# Patient Record
Sex: Male | Born: 1990 | Race: White | Hispanic: No | Marital: Married | Smoking: Never smoker
Health system: Southern US, Community
[De-identification: ages and names within clinical notes are randomized; demographics above are authoritative.]

## PROBLEM LIST (undated history)

## (undated) DIAGNOSIS — F3181 Bipolar II disorder: Secondary | ICD-10-CM

## (undated) DIAGNOSIS — I10 Essential (primary) hypertension: Secondary | ICD-10-CM

## (undated) DIAGNOSIS — F419 Anxiety disorder, unspecified: Secondary | ICD-10-CM

## (undated) DIAGNOSIS — R569 Unspecified convulsions: Secondary | ICD-10-CM

## (undated) DIAGNOSIS — F32A Depression, unspecified: Secondary | ICD-10-CM

## (undated) HISTORY — DX: Unspecified convulsions: R56.9

## (undated) HISTORY — DX: Bipolar II disorder: F31.81

---

## 2018-03-07 DIAGNOSIS — J0140 Acute pansinusitis, unspecified: Secondary | ICD-10-CM | POA: Diagnosis not present

## 2018-04-05 DIAGNOSIS — J40 Bronchitis, not specified as acute or chronic: Secondary | ICD-10-CM | POA: Diagnosis not present

## 2018-04-05 DIAGNOSIS — J0140 Acute pansinusitis, unspecified: Secondary | ICD-10-CM | POA: Diagnosis not present

## 2018-07-04 DIAGNOSIS — K219 Gastro-esophageal reflux disease without esophagitis: Secondary | ICD-10-CM | POA: Diagnosis not present

## 2018-07-04 DIAGNOSIS — J0101 Acute recurrent maxillary sinusitis: Secondary | ICD-10-CM | POA: Diagnosis not present

## 2021-02-15 ENCOUNTER — Emergency Department (HOSPITAL_COMMUNITY): Payer: Managed Care, Other (non HMO)

## 2021-02-15 ENCOUNTER — Encounter (HOSPITAL_COMMUNITY): Payer: Self-pay

## 2021-02-15 ENCOUNTER — Observation Stay (HOSPITAL_COMMUNITY)
Admission: EM | Admit: 2021-02-15 | Discharge: 2021-02-16 | Disposition: A | Discharge status: Home / Self Care | Payer: Managed Care, Other (non HMO) | Attending: Internal Medicine | Admitting: Internal Medicine

## 2021-02-15 ENCOUNTER — Observation Stay (HOSPITAL_COMMUNITY): Payer: Managed Care, Other (non HMO)

## 2021-02-15 DIAGNOSIS — R569 Unspecified convulsions: Secondary | ICD-10-CM

## 2021-02-15 DIAGNOSIS — Y900 Blood alcohol level of less than 20 mg/100 ml: Secondary | ICD-10-CM | POA: Insufficient documentation

## 2021-02-15 DIAGNOSIS — Z79899 Other long term (current) drug therapy: Secondary | ICD-10-CM | POA: Diagnosis not present

## 2021-02-15 DIAGNOSIS — W01198A Fall on same level from slipping, tripping and stumbling with subsequent striking against other object, initial encounter: Secondary | ICD-10-CM | POA: Diagnosis not present

## 2021-02-15 DIAGNOSIS — I10 Essential (primary) hypertension: Secondary | ICD-10-CM | POA: Diagnosis not present

## 2021-02-15 DIAGNOSIS — Z20822 Contact with and (suspected) exposure to covid-19: Secondary | ICD-10-CM | POA: Diagnosis not present

## 2021-02-15 DIAGNOSIS — S0182XA Laceration with foreign body of other part of head, initial encounter: Secondary | ICD-10-CM | POA: Diagnosis present

## 2021-02-15 HISTORY — DX: Depression, unspecified: F32.A

## 2021-02-15 HISTORY — DX: Essential (primary) hypertension: I10

## 2021-02-15 HISTORY — DX: Anxiety disorder, unspecified: F41.9

## 2021-02-15 LAB — URINALYSIS, ROUTINE W REFLEX MICROSCOPIC
Bacteria, UA: NONE SEEN
Bilirubin Urine: NEGATIVE
Glucose, UA: NEGATIVE mg/dL
Ketones, ur: 5 mg/dL — AB
Leukocytes,Ua: NEGATIVE
Nitrite: NEGATIVE
Protein, ur: NEGATIVE mg/dL
Specific Gravity, Urine: 1.021 (ref 1.005–1.030)
pH: 5 (ref 5.0–8.0)

## 2021-02-15 LAB — CBC
HCT: 44.9 % (ref 39.0–52.0)
HCT: 44 % (ref 39.0–52.0)
Hemoglobin: 15.6 g/dL (ref 13.0–17.0)
Hemoglobin: 15.3 g/dL (ref 13.0–17.0)
MCH: 31.1 pg (ref 26.0–34.0)
MCH: 31.4 pg (ref 26.0–34.0)
MCHC: 34.7 g/dL (ref 30.0–36.0)
MCHC: 34.8 g/dL (ref 30.0–36.0)
MCV: 89.6 fL (ref 80.0–100.0)
MCV: 90.2 fL (ref 80.0–100.0)
Platelets: 270 10*3/uL (ref 150–400)
Platelets: 243 10*3/uL (ref 150–400)
RBC: 5.01 MIL/uL (ref 4.22–5.81)
RBC: 4.88 MIL/uL (ref 4.22–5.81)
RDW: 12.8 % (ref 11.5–15.5)
RDW: 12.9 % (ref 11.5–15.5)
WBC: 12.5 10*3/uL — ABNORMAL HIGH (ref 4.0–10.5)
WBC: 10.2 10*3/uL (ref 4.0–10.5)
nRBC: 0 % (ref 0.0–0.2)
nRBC: 0 % (ref 0.0–0.2)

## 2021-02-15 LAB — BASIC METABOLIC PANEL
Anion gap: 7 (ref 5–15)
BUN: 15 mg/dL (ref 6–20)
CO2: 25 mmol/L (ref 22–32)
Calcium: 8.6 mg/dL — ABNORMAL LOW (ref 8.9–10.3)
Chloride: 105 mmol/L (ref 98–111)
Creatinine, Ser: 1.27 mg/dL — ABNORMAL HIGH (ref 0.61–1.24)
GFR, Estimated: >60 mL/min (ref >60)
Glucose, Bld: 104 mg/dL — ABNORMAL HIGH (ref 70–99)
Potassium: 4.1 mmol/L (ref 3.5–5.1)
Sodium: 137 mmol/L (ref 135–145)

## 2021-02-15 LAB — CBG MONITORING, ED
Glucose-Capillary: 87 mg/dL (ref 70–99)
Glucose-Capillary: 87 mg/dL (ref 70–99)

## 2021-02-15 LAB — RAPID URINE DRUG SCREEN, HOSP PERFORMED
Amphetamines: NOT DETECTED
Barbiturates: NOT DETECTED
Benzodiazepines: NOT DETECTED
Cocaine: NOT DETECTED
Opiates: NOT DETECTED
Tetrahydrocannabinol: NOT DETECTED

## 2021-02-15 LAB — CREATININE, SERUM
Creatinine, Ser: 0.98 mg/dL (ref 0.61–1.24)
GFR, Estimated: >60 mL/min (ref >60)

## 2021-02-15 LAB — RESP PANEL BY RT-PCR (FLU A&B, COVID) ARPGX2
Influenza A by PCR: NEGATIVE
Influenza B by PCR: NEGATIVE
SARS Coronavirus 2 by RT PCR: NEGATIVE

## 2021-02-15 LAB — ETHANOL: Alcohol, Ethyl (B): <10 mg/dL (ref <10)

## 2021-02-15 LAB — MAGNESIUM: Magnesium: 2 mg/dL (ref 1.7–2.4)

## 2021-02-15 MED ORDER — ENOXAPARIN SODIUM 40 MG/0.4ML IJ SOSY
40.0000 mg | PREFILLED_SYRINGE | INTRAMUSCULAR | Status: DC
  Administered 2021-02-15: 40 mg via SUBCUTANEOUS
  Filled 2021-02-15: qty 0.4

## 2021-02-15 MED ORDER — BACITRACIN ZINC 500 UNIT/GM EX OINT
TOPICAL_OINTMENT | Freq: Two times a day (BID) | CUTANEOUS | Status: DC
  Administered 2021-02-15 – 2021-02-16 (×2): 1 via TOPICAL
  Filled 2021-02-15 (×2): qty 0.9

## 2021-02-15 MED ORDER — LIDOCAINE-EPINEPHRINE (PF) 2 %-1:200000 IJ SOLN
10.0000 mL | Freq: Once | INTRAMUSCULAR | Status: DC
  Filled 2021-02-15: qty 20

## 2021-02-15 MED ORDER — NADOLOL 40 MG PO TABS
40.0000 mg | ORAL_TABLET | Freq: Every day | ORAL | Status: DC
  Administered 2021-02-16: 40 mg via ORAL
  Filled 2021-02-15: qty 1

## 2021-02-15 MED ORDER — ACETAMINOPHEN 650 MG RE SUPP: 650.0000 mg | Freq: Four times a day (QID) | RECTAL | Status: DC | PRN

## 2021-02-15 MED ORDER — ACETAMINOPHEN 325 MG PO TABS
650.0000 mg | ORAL_TABLET | Freq: Once | ORAL | Status: AC
  Administered 2021-02-15: 650 mg via ORAL
  Filled 2021-02-15: qty 2

## 2021-02-15 MED ORDER — ACETAMINOPHEN 325 MG PO TABS
650.0000 mg | ORAL_TABLET | Freq: Four times a day (QID) | ORAL | Status: DC | PRN
  Administered 2021-02-16: 650 mg via ORAL
  Filled 2021-02-15: qty 2

## 2021-02-15 MED ORDER — ARIPIPRAZOLE 10 MG PO TABS
10.0000 mg | ORAL_TABLET | Freq: Every day | ORAL | Status: DC
  Administered 2021-02-15: 10 mg via ORAL
  Filled 2021-02-15: qty 1

## 2021-02-15 MED ORDER — LACTATED RINGERS IV BOLUS
1000.0000 mL | Freq: Once | INTRAVENOUS | Status: AC
  Administered 2021-02-15: 1000 mL via INTRAVENOUS

## 2021-02-15 MED ORDER — GADOBUTROL 1 MMOL/ML IV SOLN
10.0000 mL | Freq: Once | INTRAVENOUS | Status: AC | PRN
  Administered 2021-02-15: 10 mL via INTRAVENOUS

## 2021-02-15 NOTE — ED Notes (Signed)
Patient transported to MRI 

## 2021-02-15 NOTE — ED Notes (Signed)
Visitor at bedside. Lac tray at bedside.

## 2021-02-15 NOTE — ED Triage Notes (Addendum)
Pt arrived via [facility] from [facility]. Pt was sitting at the table. Pt had a syncopal episode, turned pale, lasting for about 2 minutes and hit his chin. Lac on chin suture appropriate.   C/o headache   Denies recreational drug use.   CBG-105 P-62 99% RA  Takes Abilify and recently changed the time of day he takes it.   Takes a BP meds ending in LOL (beta blocker)   18g L-AC  A/ox4

## 2021-02-15 NOTE — ED Provider Notes (Signed)
[facility]-EMERGENCY DEPT Provider Note   CSN: [id number] Arrival date & time: 02/15/21  1349     History Chief Complaint  Patient presents with   Loss of Consciousness   Laceration    William Flowers is a 30 y.o. male who presents after seizure-like episode this afternoon.  Patient was at the bookstore with his wife when he began to feel very warm, suddenly developed tunnel vision and lost consciousness.  According to his wife he fell forward flat forward onto his face on the ground and his upper extremities were rigid, his jaw was clenched, he was groaning, and his whole body was shaking.  This lasted for approximately 2 minutes before it resolved.  He took him another few minutes to become oriented again but he was able to get up off the ground with assistance and walk a few feet away to sit at table.  He hit his chin on the tile surface and splinted open with significant of bleeding.  Patient denies any history of the same.  He does state that he is on Abilify for bipolar 2; recently his medication was changed from taking in the morning to taking in the evening without change in the dose.  This occurred 3 weeks ago.  This change was made by his psychiatrist due to episodes of brain fog, and concern by psychiatry that he may be having brief intermittent focal seizures.  No outpatient work-up for seizure activity was established.  I have personally reviewed this patient's medical records. He has hx of Bipolar II, hypertension on a betablocker.   HPI     Past Medical History:  Diagnosis Date   Anxiety    Depression    Hypertension     Patient Active Problem List   Diagnosis Date Noted   Seizure (HCC) 02/15/2021   Seizures (HCC) 02/15/2021     History reviewed. No pertinent surgical history.     Family History  Problem Relation Age of Onset   Seizures Neg Hx     Social History   Tobacco Use   Smoking status: Never   Smokeless tobacco: Never   Vaping Use   Vaping Use: Former  Substance Use Topics   Alcohol use: Yes    Comment: occasionally   Drug use: Never    Home Medications Prior to Admission medications   Medication Sig Start Date End Date Taking? Authorizing Provider  ARIPiprazole (ABILIFY) 10 MG tablet Take 10 mg by mouth daily. 11/28/20  Yes [provider]  levocetirizine (XYZAL) 5 MG tablet Take 5 mg by mouth daily as needed for allergies.   Yes [provider]  nadolol (CORGARD) 40 MG tablet Take 40 mg by mouth daily. 12/30/20  Yes [provider]    Allergies    Patient has no known allergies.  Review of Systems   Review of Systems  Constitutional: Negative.   HENT: Negative.    Eyes: Negative.   Respiratory: Negative.    Cardiovascular: Negative.   Gastrointestinal: Negative.   Genitourinary: Negative.   Musculoskeletal: Negative.   Skin:  Positive for wound. Negative for color change and pallor.  Neurological:  Positive for seizures, syncope and light-headedness.  Psychiatric/Behavioral: Negative.     Physical Exam Updated Vital Signs BP 121/88   Pulse 78   Temp 98.5 F (36.9 C) (Oral)   Resp (!) 25   Ht 5\' 11"  (1.803 m)   Wt 113.4 kg   SpO2 97%   BMI 34.87 kg/m  Physical Exam Vitals and nursing note reviewed.  Constitutional:      Appearance: He is obese. He is not ill-appearing or toxic-appearing.  HENT:     Head: Normocephalic. No raccoon eyes or Battle's sign.      Nose: Congestion present.     Right Nostril: No epistaxis or septal hematoma.     Left Nostril: No epistaxis or septal hematoma.     Mouth/Throat:     Mouth: Mucous membranes are moist.     Pharynx: Oropharynx is clear. Uvula midline. No oropharyngeal exudate or posterior oropharyngeal erythema.     Tonsils: No tonsillar exudate.  Eyes:     General: Lids are normal. Vision grossly intact.        Right eye: No discharge.        Left eye: No discharge.     Extraocular Movements: Extraocular  movements intact.     Conjunctiva/sclera: Conjunctivae normal.     Pupils: Pupils are equal, round, and reactive to light.  Neck:     Trachea: Trachea and phonation normal.     Meningeal: Brudzinski's sign and Kernig's sign absent.  Cardiovascular:     Rate and Rhythm: Normal rate and regular rhythm.     Pulses: Normal pulses.     Heart sounds: Normal heart sounds. No murmur heard. Pulmonary:     Effort: Pulmonary effort is normal. No tachypnea, bradypnea, accessory muscle usage, prolonged expiration or respiratory distress.     Breath sounds: Normal breath sounds. No wheezing or rales.  Chest:     Chest wall: No mass, lacerations, deformity, swelling, tenderness, crepitus or edema.  Abdominal:     General: Bowel sounds are normal. There is no distension.     Palpations: Abdomen is soft.     Tenderness: There is no abdominal tenderness. There is no right CVA tenderness, left CVA tenderness, guarding or rebound.  Musculoskeletal:        General: No deformity.     Cervical back: Normal range of motion and neck supple. No edema, rigidity or crepitus. No pain with movement, spinous process tenderness or muscular tenderness.     Right lower leg: No edema.     Left lower leg: No edema.  Lymphadenopathy:     Cervical: No cervical adenopathy.  Skin:    General: Skin is warm and dry.     Capillary Refill: Capillary refill takes less than 2 seconds.     Findings: Laceration present.  Neurological:     General: No focal deficit present.     Mental Status: He is alert and oriented to person, place, and time. Mental status is at baseline.     GCS: GCS eye subscore is 4. GCS verbal subscore is 5. GCS motor subscore is 6.     Cranial Nerves: Cranial nerves are intact.     Sensory: Sensation is intact.     Motor: Motor function is intact.     Coordination: Coordination is intact.     Gait: Gait is intact. Gait normal.  Psychiatric:        Mood and Affect: Mood normal.    ED Results /  Procedures / Treatments   Labs (all labs ordered are listed, but only abnormal results are displayed) Labs Reviewed  BASIC METABOLIC PANEL - Abnormal; Notable for the following components:      Result Value   Glucose, Bld 104 (*)    Creatinine, Ser 1.27 (*)    Calcium 8.6 (*)    All  other components within normal limits  CBC - Abnormal; Notable for the following components:   WBC 12.5 (*)    All other components within normal limits  URINALYSIS, ROUTINE W REFLEX MICROSCOPIC - Abnormal; Notable for the following components:   Hgb urine dipstick MODERATE (*)    Ketones, ur 5 (*)    All other components within normal limits  RESP PANEL BY RT-PCR (FLU A&B, COVID) ARPGX2  MAGNESIUM  RAPID URINE DRUG SCREEN, HOSP PERFORMED  ETHANOL  CBG MONITORING, ED  CBG MONITORING, ED    EKG EKG Interpretation  Date/Time:  Tuesday February 15 2021 14:23:04 EDT Ventricular Rate:  79 PR Interval:  170 QRS Duration: 89 QT Interval:  395 QTC Calculation: 453 R Axis:   17 Text Interpretation: Sinus rhythm Consider inferior infarct No acute changes No old tracing to compare Confirmed by Derwood Kaplan [phone]) on 02/15/2021 7:12:50 PM  Radiology CT HEAD WO CONTRAST ( )  Result Date: 02/15/2021 CLINICAL DATA:  Seizure, nontraumatic.  Syncopal episode. EXAM: CT HEAD WITHOUT CONTRAST TECHNIQUE: Contiguous axial images were obtained from the base of the skull through the vertex without intravenous contrast. COMPARISON:  None. FINDINGS: Brain: The brain shows a normal appearance without evidence of malformation, atrophy, old or acute small or large vessel infarction, mass lesion, hemorrhage, hydrocephalus or extra-axial collection. Vascular: No hyperdense vessel. No evidence of atherosclerotic calcification. Skull: Normal.  No traumatic finding.  No focal bone lesion. Sinuses/Orbits: Sinuses are clear. Orbits appear normal. Mastoids are clear. Other: None significant IMPRESSION: Normal head CT.  Electronically Signed   By: Paulina Fusi M.D.   On: 02/15/2021 16:38   MR BRAIN W WO CONTRAST  Result Date: 02/15/2021 CLINICAL DATA:  Seizure. EXAM: MRI HEAD WITHOUT AND WITH CONTRAST TECHNIQUE: Multiplanar, multiecho pulse sequences of the brain and surrounding structures were obtained without and with intravenous contrast. CONTRAST:  48mL GADAVIST GADOBUTROL 1 MMOL/ML IV SOLN COMPARISON:  Head CT 02/15/2021 FINDINGS: Brain: Dedicated thin section imaging through the temporal lobes demonstrates normal volume and signal of the hippocampi. There is no evidence of heterotopia or cortical dysplasia. There is no evidence of an acute infarct, intracranial hemorrhage, mass, midline shift, or extra-axial fluid collection. The ventricles and sulci are normal. The brain is normal in signal. No abnormal enhancement is identified. Vascular: Major intracranial vascular flow voids are preserved. Skull and upper cervical spine: Unremarkable bone marrow signal. Sinuses/Orbits: Unremarkable orbits. Mild mucosal thickening in the paranasal sinuses. Clear mastoid air cells. Other: None. IMPRESSION: Unremarkable appearance of the brain. Electronically Signed   By: Sebastian Ache M.D.   On: 02/15/2021 21:02    Procedures Procedures   Medications Ordered in ED Medications  lactated ringers bolus 1,000 mL (0 mLs Intravenous Stopped 02/15/21 1748)  acetaminophen (TYLENOL) tablet 650 mg (650 mg Oral Given 02/15/21 1810)  gadobutrol (GADAVIST) 1 MMOL/ML injection 10 mL (10 mLs Intravenous Contrast Given 02/15/21 2019)    ED Course  I have reviewed the triage vital signs and the nursing notes.  Pertinent labs & imaging results that were available during my care of the patient were reviewed by me and considered in my medical decision making (see chart for details).  Clinical Course as of 02/15/21 2109  Tue Feb 15, 2021  1823 Consult to neurologist Dr. Otelia Limes, who recommends admission to [facility] medicine service. Recommends  holding abilify at this time, will defer anticonvulsants until evaluation off of abilify.  Will need EEG and MRI with and without contrast of the brain on  admission.  I appreciate his collaboration in the care of this patient.  [RS]    Clinical Course User Index [RS] Stormie Ventola, Eugene Gavia, PA-C   MDM Rules/Calculators/A&P                         30 year old male presents with concern for seizure-like activity this afternoon.  No history of the same.  Differential diagnosis includes but is not limited to first-time seizure, allergic seizures, metabolic derangement, drug effect, encephalitis or meningitis, syncope, intracranial mass.  Vital signs are normal on intake.  Cardiopulmonary exam is normal, abdominal exam is benign.  Patient with normal neurologic exam without focal deficit.  Does have a 2 cm laceration underneath the chin which is oozing small amount of blood.  CBC with mild leukocytosis of 12.5, BMP with creatinine of 1.27, GFR greater than 60 and normal BUN.  CBG 87 1 hour ago.  EKG is reassuring with sinus rhythm without STEMI.  Magnesium and ethanol levels pending, UDS and UA pending.  We will proceed with CT of the head given first-time seizure.  CT head negative for acute intracranial abnormality.  UDS negative, ethanol negative.  Magnesium is also normal as well as potassium.  UA without sign of infection.  Consult to neurology as above recommends admission to the medicine service at [facility] for MRI, EEG and further neuro eval.  Will defer anticonvulsant at this time further recommendation.  We will hold Abilify as well.  Consult to hospitalist as above, patient to be admitted to [facility].  Jayvyn voiced understanding of his medical evaluation and treatment plan.  Each of his questions was answered to his expressed satisfaction.  He is amenable to plan for admission at this time.  This chart was dictated using voice recognition software, Dragon. Despite the best efforts  of this provider to proofread and correct errors, errors may still occur which can change documentation meaning.   Final Clinical Impression(s) / ED Diagnoses Final diagnoses:  None    Rx / DC Orders ED Discharge Orders     None        Sherrilee Gilles 02/15/21 2110    Derwood Kaplan, MD 02/22/21 1047

## 2021-02-15 NOTE — H&P (Signed)
History and Physical    William Flowers VQQ:[id number] DOB: Aug 05, 1990 DOA: 02/15/2021  PCP: [facility]  Patient coming from: Home.  Chief Complaint: Seizure-like activity.  HPI: William Flowers is a 30 y.o. male with history of bipolar disorder and hypertension was brought to the ER after patient had a seizure-like activity as witnessed by patient's wife today.  Patient has been having spells of tunnel vision over the last 2 months which last for about 1 or 2 minutes.  Patient's psychiatrist changed patient's Abilify from morning to evening following which patient did not have the start of admission for almost a month now.  Prior to which patient was getting frequent attacks.  Today while patient was in the cafeteria with his wife patient started feeling things were working getting warm and his eyes rolled up and fell onto the floor and had a tonic-clonic seizure-like activity which lasted for around 2 to 3 minutes during which patient was not responding to any commands and the activity stopped.  Did not have any incontinence of urine or bowel or any tongue bite.  Patient does not recall the incident.  Patient appeared mildly confused after the incident.  Did not lose consciousness.  Patient was brought to the ER.  ED Course: In the ER patient appears nonfocal.  Denies any headache.  CT head and MRI brain did not show anything acute.  On-call neurologist Dr. Caryl Pina was consulted and requested admission.  At this time did not start any antiepileptic medications.  Labs show leukocytosis of 12.5.  Patient is afebrile creatinine 1.2.  COVID test was negative.  Urine shows mild ketones.  Urine drug screen is negative.  EKG shows normal sinus rhythm.  Review of Systems: As per HPI, rest all negative.   Past Medical History:  Diagnosis Date   Anxiety    Depression    Hypertension     History reviewed. No pertinent surgical history.   reports that he has never  smoked. He has never used smokeless tobacco. He reports current alcohol use. He reports that he does not use drugs.  No Known Allergies  Family History  Problem Relation Age of Onset   Seizures Neg Hx     Prior to Admission medications   Medication Sig Start Date End Date Taking? Authorizing Provider  ARIPiprazole (ABILIFY) 10 MG tablet Take 10 mg by mouth daily. 11/28/20  Yes [provider]  levocetirizine (XYZAL) 5 MG tablet Take 5 mg by mouth daily as needed for allergies.   Yes [provider]  nadolol (CORGARD) 40 MG tablet Take 40 mg by mouth daily. 12/30/20  Yes [provider]    Physical Exam: Constitutional: Moderately built and nourished. Vitals:   02/15/21 1700 02/15/21 1800 02/15/21 1900 02/15/21 1930  BP: (!) 142/88 138/85 (!) 136/95 121/88  Pulse: 85 79 89 78  Resp: 20 (!) 22 20 (!) 25  Temp:      TempSrc:      SpO2: 99% 98% 98% 97%  Weight: 113.4 kg     Height: 5\' 11"  (1.803 m)      Eyes: Anicteric no pallor. ENMT: No discharge from the ears eyes nose and mouth. Neck: No mass felt.  No neck rigidity. Respiratory: No rhonchi or crepitations. Cardiovascular: S1-S2 heard. Abdomen: Soft nontender bowel sound present. Musculoskeletal: No edema. Skin: No rash. Neurologic: Alert awake oriented to time place and person.  Moves all extremities. Psychiatric: Appears normal.  Normal affect.  Labs on Admission: I have personally reviewed following labs and imaging studies  CBC: Recent Labs  Lab 02/15/21 1423  WBC 12.5*  HGB 15.6  HCT 44.9  MCV 89.6  PLT 270   Basic Metabolic Panel: Recent Labs  Lab 02/15/21 1423 02/15/21 1607  NA 137  --   K 4.1  --   CL 105  --   CO2 25  --   GLUCOSE 104*  --   BUN 15  --   CREATININE 1.27*  --   CALCIUM 8.6*  --   MG  --  2.0   GFR: Estimated Creatinine Clearance: 110.8 mL/min (A) (by C-G formula based on SCr of 1.27 mg/dL (H)). Liver Function Tests: No results for input(s): AST,  ALT, ALKPHOS, BILITOT, PROT, ALBUMIN in the last 168 hours. No results for input(s): LIPASE, AMYLASE in the last 168 hours. No results for input(s): AMMONIA in the last 168 hours. Coagulation Profile: No results for input(s): INR, PROTIME in the last 168 hours. Cardiac Enzymes: No results for input(s): CKTOTAL, CKMB, CKMBINDEX, TROPONINI in the last 168 hours. BNP (last 3 results) No results for input(s): PROBNP in the last 8760 hours. HbA1C: No results for input(s): HGBA1C in the last 72 hours. CBG: Recent Labs  Lab 02/15/21 1415 02/15/21 1603  GLUCAP 87 87   Lipid Profile: No results for input(s): CHOL, HDL, LDLCALC, TRIG, CHOLHDL, LDLDIRECT in the last 72 hours. Thyroid Function Tests: No results for input(s): TSH, T4TOTAL, FREET4, T3FREE, THYROIDAB in the last 72 hours. Anemia Panel: No results for input(s): VITAMINB12, FOLATE, FERRITIN, TIBC, IRON, RETICCTPCT in the last 72 hours. Urine analysis:    Component Value Date/Time   COLORURINE YELLOW 02/15/2021 1607   APPEARANCEUR CLEAR 02/15/2021 1607   LABSPEC 1.021 02/15/2021 1607   PHURINE 5.0 02/15/2021 1607   GLUCOSEU NEGATIVE 02/15/2021 1607   HGBUR MODERATE (A) 02/15/2021 1607   BILIRUBINUR NEGATIVE 02/15/2021 1607   KETONESUR 5 (A) 02/15/2021 1607   PROTEINUR NEGATIVE 02/15/2021 1607   NITRITE NEGATIVE 02/15/2021 1607   LEUKOCYTESUR NEGATIVE 02/15/2021 1607   Sepsis Labs: @LABRCNTIP (procalcitonin:4,lacticidven:4) ) Recent Results (from the past 240 hour(s))  Resp Panel by RT-PCR (Flu A&B, Covid) Nasopharyngeal Swab     Status: None   Collection Time: 02/15/21  7:01 PM   Specimen: Nasopharyngeal Swab; Nasopharyngeal(NP) swabs in vial transport medium  Result Value Ref Range Status   SARS Coronavirus 2 by RT PCR NEGATIVE NEGATIVE Final    Comment: (NOTE) SARS-CoV-2 target nucleic acids are NOT DETECTED.  The SARS-CoV-2 RNA is generally detectable in upper respiratory specimens during the acute phase of  infection. The lowest concentration of SARS-CoV-2 viral copies this assay can detect is 138 copies/mL. A negative result does not preclude SARS-Cov-2 infection and should not be used as the sole basis for treatment or other patient management decisions. A negative result may occur with  improper specimen collection/handling, submission of specimen other than nasopharyngeal swab, presence of viral mutation(s) within the areas targeted by this assay, and inadequate number of viral copies(<138 copies/mL). A negative result must be combined with clinical observations, patient history, and epidemiological information. The expected result is Negative.  Fact Sheet for Patients:  02/17/21  Fact Sheet for Healthcare Providers:  BloggerCourse.com  This test is no t yet approved or cleared by the [place] FDA and  has been authorized for detection and/or diagnosis of SARS-CoV-2 by FDA under an Emergency Use Authorization (EUA). This EUA will remain  in effect (meaning  this test can be used) for the duration of the COVID-19 declaration under Section 564(b)(1) of the Act, 21 U.S.C.section 360bbb-3(b)(1), unless the authorization is terminated  or revoked sooner.       Influenza A by PCR NEGATIVE NEGATIVE Final   Influenza B by PCR NEGATIVE NEGATIVE Final    Comment: (NOTE) The Xpert Xpress SARS-CoV-2/FLU/RSV plus assay is intended as an aid in the diagnosis of influenza from Nasopharyngeal swab specimens and should not be used as a sole basis for treatment. Nasal washings and aspirates are unacceptable for Xpert Xpress SARS-CoV-2/FLU/RSV testing.  Fact Sheet for Patients: BloggerCourse.com  Fact Sheet for Healthcare Providers: SeriousBroker.it  This test is not yet approved or cleared by the [place] FDA and has been authorized for detection and/or diagnosis of SARS-CoV-2  by FDA under an Emergency Use Authorization (EUA). This EUA will remain in effect (meaning this test can be used) for the duration of the COVID-19 declaration under Section 564(b)(1) of the Act, 21 U.S.C. section 360bbb-3(b)(1), unless the authorization is terminated or revoked.  Performed at [facility], [address] [place], [place] [address]      Radiological Exams on Admission: CT HEAD WO CONTRAST ( )  Result Date: 02/15/2021 CLINICAL DATA:  Seizure, nontraumatic.  Syncopal episode. EXAM: CT HEAD WITHOUT CONTRAST TECHNIQUE: Contiguous axial images were obtained from the base of the skull through the vertex without intravenous contrast. COMPARISON:  None. FINDINGS: Brain: The brain shows a normal appearance without evidence of malformation, atrophy, old or acute small or large vessel infarction, mass lesion, hemorrhage, hydrocephalus or extra-axial collection. Vascular: No hyperdense vessel. No evidence of atherosclerotic calcification. Skull: Normal.  No traumatic finding.  No focal bone lesion. Sinuses/Orbits: Sinuses are clear. Orbits appear normal. Mastoids are clear. Other: None significant IMPRESSION: Normal head CT. Electronically Signed   By: Paulina Fusi M.D.   On: 02/15/2021 16:38   MR BRAIN W WO CONTRAST  Result Date: 02/15/2021 CLINICAL DATA:  Seizure. EXAM: MRI HEAD WITHOUT AND WITH CONTRAST TECHNIQUE: Multiplanar, multiecho pulse sequences of the brain and surrounding structures were obtained without and with intravenous contrast. CONTRAST:  71mL GADAVIST GADOBUTROL 1 MMOL/ML IV SOLN COMPARISON:  Head CT 02/15/2021 FINDINGS: Brain: Dedicated thin section imaging through the temporal lobes demonstrates normal volume and signal of the hippocampi. There is no evidence of heterotopia or cortical dysplasia. There is no evidence of an acute infarct, intracranial hemorrhage, mass, midline shift, or extra-axial fluid collection. The ventricles and sulci are  normal. The brain is normal in signal. No abnormal enhancement is identified. Vascular: Major intracranial vascular flow voids are preserved. Skull and upper cervical spine: Unremarkable bone marrow signal. Sinuses/Orbits: Unremarkable orbits. Mild mucosal thickening in the paranasal sinuses. Clear mastoid air cells. Other: None. IMPRESSION: Unremarkable appearance of the brain. Electronically Signed   By: Sebastian Ache M.D.   On: 02/15/2021 21:02    EKG: Independently reviewed.  Normal sinus rhythm.  Assessment/Plan Principal Problem:   Seizure (HCC) Active Problems:   Seizures (HCC)   Essential hypertension   Seizure-like activity (HCC)    Seizure-like activities for which I have discussed with on-call neurologist Dr. Caryl Pina who advised patient to be transferred to [facility].  At this time no antiepileptic medication has been started.  We will be closely monitoring and will follow further recommendations per neurology. History of bipolar disorder on Abilify.   History of hypertension on beta-blockers.   DVT prophylaxis: Lovenox. Code Status: Full code. Family Communication:  Patient's wife at the bedside. Disposition Plan: Home. Consults called: Neurology. Admission status: Observation.   Eduard Clos MD [facility] Pager [phone].  If 7PM-7AM, please contact night-coverage [email or web address] Password [facility]  02/15/2021, 9:46 PM

## 2021-02-15 NOTE — ED Notes (Signed)
Patient is aware we need a urine sample. He said he is not able to go at this time.

## 2021-02-16 ENCOUNTER — Observation Stay (HOSPITAL_BASED_OUTPATIENT_CLINIC_OR_DEPARTMENT_OTHER)
Admission: EM | Admit: 2021-02-16 | Discharge: 2021-02-16 | Disposition: A | Discharge status: Home / Self Care | Payer: Managed Care, Other (non HMO) | Source: Home / Self Care | Attending: Internal Medicine | Admitting: Internal Medicine

## 2021-02-16 DIAGNOSIS — R569 Unspecified convulsions: Secondary | ICD-10-CM | POA: Diagnosis not present

## 2021-02-16 DIAGNOSIS — I10 Essential (primary) hypertension: Secondary | ICD-10-CM | POA: Diagnosis not present

## 2021-02-16 LAB — CBC
HCT: 46.5 % (ref 39.0–52.0)
Hemoglobin: 16.1 g/dL (ref 13.0–17.0)
MCH: 30.8 pg (ref 26.0–34.0)
MCHC: 34.6 g/dL (ref 30.0–36.0)
MCV: 88.9 fL (ref 80.0–100.0)
Platelets: 245 10*3/uL (ref 150–400)
RBC: 5.23 MIL/uL (ref 4.22–5.81)
RDW: 12.7 % (ref 11.5–15.5)
WBC: 9.4 10*3/uL (ref 4.0–10.5)
nRBC: 0 % (ref 0.0–0.2)

## 2021-02-16 LAB — MAGNESIUM: Magnesium: 2 mg/dL (ref 1.7–2.4)

## 2021-02-16 LAB — BASIC METABOLIC PANEL
Anion gap: 6 (ref 5–15)
BUN: 13 mg/dL (ref 6–20)
CO2: 25 mmol/L (ref 22–32)
Calcium: 8.9 mg/dL (ref 8.9–10.3)
Chloride: 105 mmol/L (ref 98–111)
Creatinine, Ser: 0.93 mg/dL (ref 0.61–1.24)
GFR, Estimated: >60 mL/min (ref >60)
Glucose, Bld: 86 mg/dL (ref 70–99)
Potassium: 3.8 mmol/L (ref 3.5–5.1)
Sodium: 136 mmol/L (ref 135–145)

## 2021-02-16 LAB — HIV ANTIBODY (ROUTINE TESTING W REFLEX): HIV Screen 4th Generation wRfx: NONREACTIVE

## 2021-02-16 MED ORDER — SODIUM CHLORIDE 0.9 % IV SOLN
2000.0000 mg | Freq: Once | INTRAVENOUS | Status: AC
  Administered 2021-02-16: 2000 mg via INTRAVENOUS
  Filled 2021-02-16: qty 20

## 2021-02-16 MED ORDER — LEVETIRACETAM 500 MG PO TABS: 500.0000 mg | ORAL_TABLET | Freq: Two times a day (BID) | ORAL | 1 refills | Status: DC

## 2021-02-16 MED ORDER — BACITRACIN ZINC 500 UNIT/GM EX OINT: TOPICAL_OINTMENT | Freq: Two times a day (BID) | CUTANEOUS | 0 refills | Status: DC

## 2021-02-16 MED ORDER — ARIPIPRAZOLE 10 MG PO TABS: 10.0000 mg | ORAL_TABLET | Freq: Every day | ORAL | Status: DC

## 2021-02-16 NOTE — ED Notes (Signed)
Pt given lunch tray.

## 2021-02-16 NOTE — Consult Note (Signed)
Neurology Consultation  Reason for Consult: GTC seizure.   Referring Physician: Debby Bud., MD.   CC: Seizure   History is obtained from: patient.   HPI: [name] is a 30 yo male with a PMHx of Bipolar disorder (on Abilify) and HTN who presented to the [facility] ED 20 hours ago with seizure-like activity witnessed by his wife. Patient reports episodes at work, where he sees a white spot on screen which is surrounded by a gray/blackish coloration; he describes this as having a "tunnel vision" quality. He feels off during the episodes, as though he is "zoning out". This was occurring about 5 x per week, only at work, for the past 4 months. He didn't quite feel like himself afterwards and said he was a little disoriented and sleepy. His psychiatrist subsequently changed his Abilify from day time to night time, and this has cut down on these spells to about 1x a week.   He reports being in the [employer] cafe with his wife (who works there) yesterday. He was sitting at a table. He stated he didn't feel well, like being hot. His wife got up to get him some water, and she noticed him standing up and falling to the floor in a "tree falling" presentation. Wife said he was making a groaning sound that lasted the entire time. His eyes rolled back, his arms and hands became contracted/flexed, legs hyperextended, then he began to shake all over. This lasted about 2 minutes before the activity ceased. Afterwards, the patient was still not talking or reacting for about 10 minutes. He then began to be able to speak. No tongue biting, urinary or bowel incontinence. He did suffer a laceration to his chin. He does not remember the event.   He has had no further seizure activity since here in the ED.  His workup thus far, includes negative CTH and MRI brain. Mild leukocytosis on admission (neg now), neg UDS and Ethanol screen.   No history of seizures, TBI, concussion, brain surgery, brain bleed or stroke.  No FHx of seizures   Neurology asked to consult for seizure activity.   ROS: A robust ROS was performed and is negative except as noted in the HPI.   Past Medical History:  Diagnosis Date   Anxiety    Depression    Hypertension   Bipolar  Family History  Problem Relation Age of Onset   Seizures Neg Hx   No hx of seizures.   Social History:   reports that he has never smoked. He has never used smokeless tobacco. He reports current alcohol use. He reports that he does not use drugs.  Medications  Current Facility-Administered Medications:    acetaminophen (TYLENOL) tablet 650 mg, 650 mg, Oral, Q6H PRN **OR** acetaminophen (TYLENOL) suppository 650 mg, 650 mg, Rectal, Q6H PRN, Eduard Clos, MD   ARIPiprazole (ABILIFY) tablet 10 mg, 10 mg, Oral, QHS, Eduard Clos, MD, 10 mg at 02/15/21 2223   bacitracin ointment, , Topical, BID, Eduard Clos, MD, 1 application at 02/16/21 0945   enoxaparin (LOVENOX) injection 40 mg, 40 mg, Subcutaneous, Q24H, Eduard Clos, MD, 40 mg at 02/15/21 2224   nadolol (CORGARD) tablet 40 mg, 40 mg, Oral, Daily, Eduard Clos, MD, 40 mg at 02/16/21 0946  Current Outpatient Medications:    ARIPiprazole (ABILIFY) 10 MG tablet, Take 10 mg by mouth daily., Disp: , Rfl:    levocetirizine (XYZAL) 5 MG tablet, Take 5 mg by mouth  daily as needed for allergies., Disp: , Rfl:    nadolol (CORGARD) 40 MG tablet, Take 40 mg by mouth daily., Disp: , Rfl:   Exam: Current vital signs: BP (!) 139/101   Pulse 96   Temp 98.5 F (36.9 C) (Oral)   Resp 16   Ht 5\' 11"  (1.803 m)   Wt 113.4 kg   SpO2 97%   BMI 34.87 kg/m  Vital signs in last 24 hours: Temp:  [98.5 F (36.9 C)] 98.5 F (36.9 C) (10/18 1420) Pulse Rate:  [72-97] 96 (10/19 1000) Resp:  [15-35] 16 (10/19 1000) BP: (108-142)/(84-101) 139/101 (10/19 1000) SpO2:  [95 %-99 %] 97 % (10/19 1000) Weight:  [113.4 kg] 113.4 kg (10/18 1700)  PE: GENERAL: Very well  appearing male, awake, alert in NAD. HEENT: - Normocephalic and atraumatic, moist mucous membranes. LUNGS - Normal respiratory effort.  CV - RRR. ABDOMEN - Soft, nontender. Ext: warm, well perfused. Psych: Affect is light.   NEURO:  Mental Status: Awake, alert, and oriented x4.  Speech/Language: speech is without dysarthria or aphasia. Naming, repetition, fluency, and comprehension intact. Cranial Nerves:  II: PERRL 4 mm/brisk. visual fields full. III, IV, VI: EOMI. Lid elevation symmetric and full.  V: sensation is intact and symmetrical to face.  VII: Smile is symmetrical.  VIII:hearing intact to voice. IX, X: palate elevation is symmetric. Phonation normal.  XI: normal sternocleidomastoid and trapezius muscle strength. 01-13-1992 is symmetrical without fasciculations.   Motor:  5/5 throughout all muscle groups.  Tone is normal. Bulk is normal.  Sensation- Intact to light touch bilaterally in all four extremities. Extinction absent to DSS.  Coordination: FTN intact bilaterally. HKS intact bilaterally. No drift.  DTRs: 2+ Biceps, brachioradialus, patella bilaterally.  Cerebellar: No tremor, clonus. Gait: Deferred.  CBC    Component Value Date/Time   WBC 9.4 02/16/2021 0545   RBC 5.23 02/16/2021 0545   HGB 16.1 02/16/2021 0545   HCT 46.5 02/16/2021 0545   PLT 245 02/16/2021 0545   MCV 88.9 02/16/2021 0545   MCH 30.8 02/16/2021 0545   MCHC 34.6 02/16/2021 0545   RDW 12.7 02/16/2021 0545    CMP     Component Value Date/Time   NA 136 02/16/2021 0545   K 3.8 02/16/2021 0545   CL 105 02/16/2021 0545   CO2 25 02/16/2021 0545   GLUCOSE 86 02/16/2021 0545   BUN 13 02/16/2021 0545   CREATININE 0.93 02/16/2021 0545   CALCIUM 8.9 02/16/2021 0545   GFRNONAA >60 02/16/2021 0545   Imaging  CT head: Normal  MRI brain: Normal   EEG: This study is within normal limits. No seizures or epileptiform discharges were seen throughout the recording.   Assessment: 30 yo male  who presented after a first time GTC seizure in the field and when questioned, he told about his spells of staring at work. The staring spells have lessened in number since psychiatrist changed his Abilify to night time. His description of event in cafe, is consistent with GTC seizure. His neurology exam is normal.   - MRI brain: Normal.  - EEG: Normal.  - Neurological exam: Normal - His prior spells over the past 4 months consisting of tunnel vision and altered level of consciousness are stereotyped and one component of the semiology, tunnel vision, was also present just prior to the onset of the GTC seizure that precipitated his admission to the hospital. 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 He is classifiable as having more than one seizure and therefore is a candidate for anticonvulsant therapy, despite normal MRI and EEG.   Recommendations: -Does not need transfer to [facility] due to plan to only do a spot EEG.   - Load with IV Keppra 2000 mg followed by 500 mg po BID - Continue Abilify. Will need outpatient Psychiatry follow up.  - Out patient f/up with neurology - referral placed.  - Per [facility] statutes, patients with seizures are not allowed to drive until they have been seizure-free for six months. Use caution when using heavy equipment or power tools. Avoid working on ladders or at heights. Take showers instead of baths. Ensure the water temperature is not too high on the home water heater. Do not go swimming alone. Do not lock yourself in a room alone (i.e. bathroom). When caring for infants or small children, sit down when holding, feeding, or changing them to minimize risk of injury to the child in the event you have a seizure. Maintain good sleep hygiene. Avoid alcohol.     Addendum: Spoke to hospitalist and wife had additional questions. NP called wife and explained that our plan depends on results of EEG.  Explained it is good that changing Abilify to night has decreased these staring spells at work. Asked her to call patient's psychiatrist asap and inform him of what happened with the change to time of Abilify. And let psychiatrist know that he had a seizure.   Pt seen by William Norman, NP/Neuro and later by MD.   Pager: [phone]  I have seen and examined the patient. I have formulated the assessment and recommendations. 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 Exam nonfocal. His prior spells over the past 4 months consisting of tunnel vision and altered level of consciousness are stereotyped and one component of the semiology, tunnel vision, was also present just prior to the onset of the GTC seizure that precipitated his admission to the hospital. 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 He is classifiable as having more than one seizure and therefore is a candidate for anticonvulsant therapy, despite normal MRI and EEG.  Recommendations include Keppra load and outpatient neurology follow up. Electronically signed: Dr. Caryl Pina  Electronically signed: Dr. Caryl Pina

## 2021-02-16 NOTE — Discharge Instructions (Signed)

## 2021-02-16 NOTE — Procedures (Signed)
Patient Name: William Flowers  MRN: [id number]  Epilepsy Attending: Charlsie Quest  Referring Physician/Provider: Jimmye Norman, NP Date: 02/16/2021 Duration: 26.26 mins  Patient history: 30 yo M with witnessed seizure-like activity on 02/15/2021 at approximately 1 PM. Preceded by brief/30 seconds of feeling hot and tunnel vision followed by LOC and reported GTC activity lasting about 2 to 3 minutes followed by transient postictal confusion. EEG to evaluate for seizure.  Level of alertness: Awake  AEDs during EEG study: None  Technical aspects: This EEG study was done with scalp electrodes positioned according to the 10-20 International system of electrode placement. Electrical activity was acquired at a sampling rate of 500Hz  and reviewed with a high frequency filter of 70Hz  and a low frequency filter of 1Hz . EEG data were recorded continuously and digitally stored.   Description: The posterior dominant rhythm consists of 11Hz  activity of moderate voltage (25-35 uV) seen predominantly in posterior head regions, symmetric and reactive to eye opening and eye closing. Hyperventilation did not show any EEG change. Physiologic photic driving was seen during photic stimulation.    IMPRESSION: This study is within normal limits. No seizures or epileptiform discharges were seen throughout the recording.  Tomika Eckles 

## 2021-02-16 NOTE — ED Notes (Signed)
Pt given breakfast tray

## 2021-02-16 NOTE — Discharge Summary (Signed)
Physician Discharge Summary  William Flowers SFK:[id number] DOB: 1991-04-01  PCP: Mitzi Hansen, NP  Admitted from: Home Discharged to: Home  Admit date: 02/15/2021 Discharge date: 02/16/2021  Recommendations for Outpatient Follow-up:    Follow-up Information     Mitzi Hansen, NP. Schedule an appointment as soon as possible for a visit in 1 week(s).   Specialty: Nurse Practitioner Why: To be seen with repeat labs (CBC & CMP). Contact information: [address] [place] [address] [phone]         Primary Psychiatrist. Schedule an appointment as soon as possible for a visit in 1 week(s).   Why: Call for an appointment to be seen as soon as able.                 Home Health: None    Equipment/Devices: None    Discharge Condition: Improved and stable   Code Status: Full Code Diet recommendation:     Discharge Diagnoses:  Principal Problem:   Seizure (HCC) Active Problems:   Seizures (HCC)   Essential hypertension   Seizure-like activity (HCC)   Brief Summary: 30 year old married male with medical history significant for bipolar disorder and hypertension initially presented to the [facility] ED on 02/15/2021 following a witnessed seizure-like activity, LOC, fall with hit to his chin.  Neurology was consulted.     Assessment & Plan:   Seizure x1 - Had witnessed seizure-like activity on 02/15/2021 at approximately 1 PM - Preceded by brief/30 seconds of feeling hot and tunnel vision followed by LOC and reported GTC activity lasting about 2 to 3 minutes followed by transient postictal confusion. - No prior or family history of seizures. - Has had history of "tunnel vision" episodes lasting 1 to 2 minutes without associated seizure-like activity for the last 2 to 3 months which resolved after changing Abilify from daytime to nighttime dosing and did not have any further episodes for a month until day of admission. -?  Tunnel vision are aura -  CT head and MRI brain without acute findings. - EEG: Normal. - UDS and BAL negative. - Seizure precautions. - Patient aware and has been counseled again that he should not drive for 6 months and other seizure precautions were counseled. - Initial plans were for patient to transfer from [facility] ED to [facility] for further evaluation including neurology consultation and EEG.  However neurology saw him in the [facility] ED, recommended no indication for transfer at this time.  They evaluated him in the ED.  I discussed with Dr. Otelia Limes, neurology who recommended Keppra 2 g IV load x1 dose followed by oral Keppra 500 Mg twice daily and that patient can be discharged after the Keppra load with outpatient follow-up with neurology.  He indicated that prior episodes of tunnel vision and current presentation with seizure-like activity were possibly complex partial seizures and hence initiation of AEDs.   Acute kidney injury -Unclear etiology.  Creatinine improved from 1.27 on admission to 0.93. - Resolved.   Bipolar disorder: - Stable. - Abilify dosing as noted above. - Patient denies any other recent change in his medications i.e. additions or deletions. - Patient and spouse unable to recollect name of primary psychiatrist but have been advised to contact her for an early follow-up appointment for medication management.  They verbalized understanding.  Essential hypertension: - Controlled on nadolol.   Body mass index is 34.87 kg/m./Obesity        Consultants:   Neurology  Procedures:   EEG IMPRESSION: This study is within normal limits. No seizures or epileptiform discharges were seen throughout the recording.    Discharge Instructions  Discharge Instructions     Ambulatory referral to Neurology   Complete by: As directed    An appointment is requested in approximately: 2-4 weeks. 1st time GTC and staring spells at work.   Call MD for:   Complete  by: As directed    Recurrent seizure-like activity.   Diet - low sodium heart healthy   Complete by: As directed    Discharge instructions   Complete by: As directed    Per [facility] statutes, patients with seizures are not allowed to drive until they have been seizure-free for six months.  Use caution when using heavy equipment or power tools. Avoid working on ladders or at heights. Take showers instead of baths. Ensure the water temperature is not too high on the home water heater. Do not go swimming alone. Do not lock yourself in a room alone (i.e. bathroom). When caring for infants or small children, sit down when holding, feeding, or changing them to minimize risk of injury to the child in the event you have a seizure. Maintain good sleep hygiene. Avoid alcohol.    If patient has another seizure, call 911 and bring them back to the ED if: A.  The seizure lasts longer than 5 minutes.      B.  The patient doesn't wake shortly after the seizure or has new problems such as difficulty seeing, speaking or moving following the seizure C.  The patient was injured during the seizure D.  The patient has a temperature over 102 F (39C) E.  The patient vomited during the seizure and now is having trouble breathing   Increase activity slowly   Complete by: As directed         Medication List     TAKE these medications    ARIPiprazole 10 MG tablet Commonly known as: ABILIFY Take 1 tablet (10 mg total) by mouth at bedtime. What changed: when to take this   bacitracin ointment Apply topically 2 (two) times daily. Apply to wound on chin.   levETIRAcetam 500 MG tablet Commonly known as: Keppra Take 1 tablet (500 mg total) by mouth 2 (two) times daily. Start taking on: February 17, 2021   levocetirizine 5 MG tablet Commonly known as: XYZAL Take 5 mg by mouth daily as needed for allergies.   nadolol 40 MG tablet Commonly known as: CORGARD Take 40 mg by mouth daily.       No  Known Allergies    Procedures/Studies: CT HEAD WO CONTRAST ( )  Result Date: 02/15/2021 CLINICAL DATA:  Seizure, nontraumatic.  Syncopal episode. EXAM: CT HEAD WITHOUT CONTRAST TECHNIQUE: Contiguous axial images were obtained from the base of the skull through the vertex without intravenous contrast. COMPARISON:  None. FINDINGS: Brain: The brain shows a normal appearance without evidence of malformation, atrophy, old or acute small or large vessel infarction, mass lesion, hemorrhage, hydrocephalus or extra-axial collection. Vascular: No hyperdense vessel. No evidence of atherosclerotic calcification. Skull: Normal.  No traumatic finding.  No focal bone lesion. Sinuses/Orbits: Sinuses are clear. Orbits appear normal. Mastoids are clear. Other: None significant IMPRESSION: Normal head CT. Electronically Signed   By: Paulina Fusi M.D.   On: 02/15/2021 16:38   MR BRAIN W WO CONTRAST  Result Date: 02/15/2021 CLINICAL DATA:  Seizure. EXAM: MRI HEAD WITHOUT AND WITH CONTRAST TECHNIQUE: Multiplanar, multiecho  pulse sequences of the brain and surrounding structures were obtained without and with intravenous contrast. CONTRAST:  12mL GADAVIST GADOBUTROL 1 MMOL/ML IV SOLN COMPARISON:  Head CT 02/15/2021 FINDINGS: Brain: Dedicated thin section imaging through the temporal lobes demonstrates normal volume and signal of the hippocampi. There is no evidence of heterotopia or cortical dysplasia. There is no evidence of an acute infarct, intracranial hemorrhage, mass, midline shift, or extra-axial fluid collection. The ventricles and sulci are normal. The brain is normal in signal. No abnormal enhancement is identified. Vascular: Major intracranial vascular flow voids are preserved. Skull and upper cervical spine: Unremarkable bone marrow signal. Sinuses/Orbits: Unremarkable orbits. Mild mucosal thickening in the paranasal sinuses. Clear mastoid air cells. Other: None. IMPRESSION: Unremarkable appearance of the brain.  Electronically Signed   By: Sebastian Ache M.D.   On: 02/15/2021 21:02      Subjective: History as noted above.  This was his first episode of seizures.  No further seizure since hospital admission.  Denies any other complaints.  His chin hurts a little bit where he fell and sustained blunt trauma.  Denies substance abuse.  Discharge Exam:  Vitals:   02/16/21 1000 02/16/21 1030 02/16/21 1340 02/16/21 1630  BP: (!) 139/101 (!) 119/97 (!) 129/92 (!) 124/101  Pulse: 96 96 87 80  Resp: 16 16 15 16   Temp:      TempSrc:      SpO2: 97% 96% 96% 97%  Weight:      Height:        General exam: Young male, moderately built and obese sitting up comfortably in bed without distress.  Oral mucosa moist. Respiratory system: Clear to auscultation. Respiratory effort normal. Cardiovascular system: S1 & S2 heard, RRR. No JVD, murmurs, rubs, gallops or clicks. No pedal edema. Gastrointestinal system: Abdomen is nondistended, soft and nontender. No organomegaly or masses felt. Normal bowel sounds heard. Central nervous system: Alert and oriented. No focal neurological deficits. Extremities: Symmetric 5 x 5 power. Skin: No rashes, lesions or ulcers.  Minimal dried blood on his chin/Has beard and difficult to clearly examine.  No obvious large cut or laceration. Psychiatry: Judgement and insight appear normal. Mood & affect appropriate.    The results of significant diagnostics from this hospitalization (including imaging, microbiology, ancillary and laboratory) are listed below for reference.     Microbiology: Recent Results (from the past 240 hour(s))  Resp Panel by RT-PCR (Flu A&B, Covid) Nasopharyngeal Swab     Status: None   Collection Time: 02/15/21  7:01 PM   Specimen: Nasopharyngeal Swab; Nasopharyngeal(NP) swabs in vial transport medium  Result Value Ref Range Status   SARS Coronavirus 2 by RT PCR NEGATIVE NEGATIVE Final    Comment: (NOTE) SARS-CoV-2 target nucleic acids are NOT  DETECTED.  The SARS-CoV-2 RNA is generally detectable in upper respiratory specimens during the acute phase of infection. The lowest concentration of SARS-CoV-2 viral copies this assay can detect is 138 copies/mL. A negative result does not preclude SARS-Cov-2 infection and should not be used as the sole basis for treatment or other patient management decisions. A negative result may occur with  improper specimen collection/handling, submission of specimen other than nasopharyngeal swab, presence of viral mutation(s) within the areas targeted by this assay, and inadequate number of viral copies(<138 copies/mL). A negative result must be combined with clinical observations, patient history, and epidemiological information. The expected result is Negative.  Fact Sheet for Patients:  02/17/21  Fact Sheet for Healthcare Providers:  BloggerCourse.com  This  test is no t yet approved or cleared by the [place] and  has been authorized for detection and/or diagnosis of SARS-CoV-2 by FDA under an Emergency Use Authorization (EUA). This EUA will remain  in effect (meaning this test can be used) for the duration of the COVID-19 declaration under Section 564(b)(1) of the Act, 21 U.S.C.section 360bbb-3(b)(1), unless the authorization is terminated  or revoked sooner.       Influenza A by PCR NEGATIVE NEGATIVE Final   Influenza B by PCR NEGATIVE NEGATIVE Final    Comment: (NOTE) The Xpert Xpress SARS-CoV-2/FLU/RSV plus assay is intended as an aid in the diagnosis of influenza from Nasopharyngeal swab specimens and should not be used as a sole basis for treatment. Nasal washings and aspirates are unacceptable for Xpert Xpress SARS-CoV-2/FLU/RSV testing.  Fact Sheet for Patients: BloggerCourse.com  Fact Sheet for Healthcare Providers: SeriousBroker.it  This test is not yet  approved or cleared by the [place] FDA and has been authorized for detection and/or diagnosis of SARS-CoV-2 by FDA under an Emergency Use Authorization (EUA). This EUA will remain in effect (meaning this test can be used) for the duration of the COVID-19 declaration under Section 564(b)(1) of the Act, 21 U.S.C. section 360bbb-3(b)(1), unless the authorization is terminated or revoked.  Performed at [facility], [address] [place], [place] [address]      Labs: CBC: Recent Labs  Lab 02/15/21 1423 02/15/21 2301 02/16/21 0545  WBC 12.5* 10.2 9.4  HGB 15.6 15.3 16.1  HCT 44.9 44.0 46.5  MCV 89.6 90.2 88.9  PLT 270 243 245    Basic Metabolic Panel: Recent Labs  Lab 02/15/21 1423 02/15/21 1607 02/15/21 2301 02/16/21 0545  NA 137  --   --  136  K 4.1  --   --  3.8  CL 105  --   --  105  CO2 25  --   --  25  GLUCOSE 104*  --   --  86  BUN 15  --   --  13  CREATININE 1.27*  --  0.98 0.93  CALCIUM 8.6*  --   --  8.9  MG  --  2.0  --  2.0    Liver Function Tests: No results for input(s): AST, ALT, ALKPHOS, BILITOT, PROT, ALBUMIN in the last 168 hours.  CBG: Recent Labs  Lab 02/15/21 1415 02/15/21 1603  GLUCAP 87 87     Urinalysis    Component Value Date/Time   COLORURINE YELLOW 02/15/2021 1607   APPEARANCEUR CLEAR 02/15/2021 1607   LABSPEC 1.021 02/15/2021 1607   PHURINE 5.0 02/15/2021 1607   GLUCOSEU NEGATIVE 02/15/2021 1607   HGBUR MODERATE (A) 02/15/2021 1607   BILIRUBINUR NEGATIVE 02/15/2021 1607   KETONESUR 5 (A) 02/15/2021 1607   PROTEINUR NEGATIVE 02/15/2021 1607   NITRITE NEGATIVE 02/15/2021 1607   LEUKOCYTESUR NEGATIVE 02/15/2021 1607    I discussed in detail with patient spouse via phone, updated care and answered all questions.  Time coordinating discharge: 25 minutes  SIGNED:  Marcellus Scott, MD, FACP, [facility]. [facility]  To contact the attending provider between 7A-7P or the covering provider  during after hours 7P-7A, please log into the web site [email or web address] and access using universal [place] password for that web site. If you do not have the password, please call the hospital operator.

## 2021-02-16 NOTE — Progress Notes (Signed)
EEG complete - results pending 

## 2021-02-16 NOTE — ED Notes (Signed)
Received report

## 2021-02-16 NOTE — Progress Notes (Addendum)
PROGRESS NOTE   William Flowers  HLK:[id number]    DOB: 1991/01/07    DOA: 02/15/2021  PCP: [facility]   I have briefly reviewed patients previous medical records in [facility].  Chief Complaint  Patient presents with   Loss of Consciousness   Laceration    Brief Narrative:  30 year old married male with medical history significant for bipolar disorder and hypertension initially presented to the [facility] ED on 02/15/2021 following a witnessed seizure-like activity, LOC, fall with hit to his chin.  Awaiting transfer to [facility] for further evaluation by neurology.   Assessment & Plan:  Principal Problem:   Seizure (HCC) Active Problems:   Seizures (HCC)   Essential hypertension   Seizure-like activity (HCC)   Seizure x1 - Had witnessed seizure-like activity on 02/15/2021 at approximately 1 PM - Preceded by brief/30 seconds of feeling hot and tunnel vision followed by LOC and reported GTC activity lasting about 2 to 3 minutes followed by transient postictal confusion. - No prior or family history of seizures. - Has had history of "tunnel vision" episodes lasting 1 to 2 minutes without associated seizure-like activity for the last 2 to 3 months which resolved after changing Abilify from daytime to nighttime dosing and did not have any further episodes for a month until day of admission. -?  Tunnel vision are aura - CT head and MRI brain without acute findings.  EEG yet to be done.  UDS and BAL negative. - Seizure precautions. - Awaiting transfer to [facility] for further evaluation by neurology.  Case was discussed by admitting MD with neurology at [facility] overnight, no AEDs started.  Will need outpatient neurology follow-up at discharge. - Patient aware and has been counseled again that he should not drive for 6 months and other seizure precautions were counseled.  Acute kidney injury -Unclear etiology.  Creatinine improved from 1.27  on admission to 0.93. - Resolved.  Bipolar disorder: - Stable. - Abilify dosing as noted above. - Patient denies any other recent change in his medications i.e. additions or deletions.  Essential hypertension: - Controlled on nadolol.  Body mass index is 34.87 kg/m./Obesity   DVT prophylaxis: enoxaparin (LOVENOX) injection 40 mg Start: 02/15/21 2200     Code Status: Full Code Family Communication: None at bedside. Disposition:  Status is: Observation  The patient remains OBS appropriate and will d/c before 2 midnights.       Consultants:   Neurology-pending  Procedures:   None  Antimicrobials:    Anti-infectives (From admission, onward)    None         Subjective:  History as noted above.  This was his first episode of seizures.  No further seizure since hospital admission.  Denies any other complaints.  His chin hurts a little bit where he fell and sustained blunt trauma.  Denies substance abuse.  Objective:   Vitals:   02/16/21 0030 02/16/21 0130 02/16/21 0315 02/16/21 0643  BP: (!) 121/92 115/84 108/85 118/87  Pulse: 86 96 72 72  Resp: 18 18 18 18   Temp:      TempSrc:      SpO2: 95% 96% 95% 97%  Weight:      Height:        General exam: Young male, moderately built and obese sitting up comfortably in bed without distress.  Oral mucosa moist. Respiratory system: Clear to auscultation. Respiratory effort normal. Cardiovascular system: S1 & S2 heard, RRR. No JVD, murmurs,  rubs, gallops or clicks. No pedal edema. Gastrointestinal system: Abdomen is nondistended, soft and nontender. No organomegaly or masses felt. Normal bowel sounds heard. Central nervous system: Alert and oriented. No focal neurological deficits. Extremities: Symmetric 5 x 5 power. Skin: No rashes, lesions or ulcers.  Minimal dried blood on his chin/Has beard and difficult to clearly examine.  No obvious large cut or laceration. Psychiatry: Judgement and insight appear normal. Mood &  affect appropriate.     Data Reviewed:   I have personally reviewed following labs and imaging studies   CBC: Recent Labs  Lab 02/15/21 1423 02/15/21 2301 02/16/21 0545  WBC 12.5* 10.2 9.4  HGB 15.6 15.3 16.1  HCT 44.9 44.0 46.5  MCV 89.6 90.2 88.9  PLT 270 243 245    Basic Metabolic Panel: Recent Labs  Lab 02/15/21 1423 02/15/21 1607 02/15/21 2301 02/16/21 0545  NA 137  --   --  136  K 4.1  --   --  3.8  CL 105  --   --  105  CO2 25  --   --  25  GLUCOSE 104*  --   --  86  BUN 15  --   --  13  CREATININE 1.27*  --  0.98 0.93  CALCIUM 8.6*  --   --  8.9  MG  --  2.0  --  2.0    Liver Function Tests: No results for input(s): AST, ALT, ALKPHOS, BILITOT, PROT, ALBUMIN in the last 168 hours.  CBG: Recent Labs  Lab 02/15/21 1415 02/15/21 1603  GLUCAP 87 87    Microbiology Studies:   Recent Results (from the past 240 hour(s))  Resp Panel by RT-PCR (Flu A&B, Covid) Nasopharyngeal Swab     Status: None   Collection Time: 02/15/21  7:01 PM   Specimen: Nasopharyngeal Swab; Nasopharyngeal(NP) swabs in vial transport medium  Result Value Ref Range Status   SARS Coronavirus 2 by RT PCR NEGATIVE NEGATIVE Final    Comment: (NOTE) SARS-CoV-2 target nucleic acids are NOT DETECTED.  The SARS-CoV-2 RNA is generally detectable in upper respiratory specimens during the acute phase of infection. The lowest concentration of SARS-CoV-2 viral copies this assay can detect is 138 copies/mL. A negative result does not preclude SARS-Cov-2 infection and should not be used as the sole basis for treatment or other patient management decisions. A negative result may occur with  improper specimen collection/handling, submission of specimen other than nasopharyngeal swab, presence of viral mutation(s) within the areas targeted by this assay, and inadequate number of viral copies(<138 copies/mL). A negative result must be combined with clinical observations, patient history, and  epidemiological information. The expected result is Negative.  Fact Sheet for Patients:  BloggerCourse.com  Fact Sheet for Healthcare Providers:  SeriousBroker.it  This test is no t yet approved or cleared by the [place] FDA and  has been authorized for detection and/or diagnosis of SARS-CoV-2 by FDA under an Emergency Use Authorization (EUA). This EUA will remain  in effect (meaning this test can be used) for the duration of the COVID-19 declaration under Section 564(b)(1) of the Act, 21 U.S.C.section 360bbb-3(b)(1), unless the authorization is terminated  or revoked sooner.       Influenza A by PCR NEGATIVE NEGATIVE Final   Influenza B by PCR NEGATIVE NEGATIVE Final    Comment: (NOTE) The Xpert Xpress SARS-CoV-2/FLU/RSV plus assay is intended as an aid in the diagnosis of influenza from Nasopharyngeal swab specimens and should not be used  as a sole basis for treatment. Nasal washings and aspirates are unacceptable for Xpert Xpress SARS-CoV-2/FLU/RSV testing.  Fact Sheet for Patients: BloggerCourse.com  Fact Sheet for Healthcare Providers: SeriousBroker.it  This test is not yet approved or cleared by the [place] FDA and has been authorized for detection and/or diagnosis of SARS-CoV-2 by FDA under an Emergency Use Authorization (EUA). This EUA will remain in effect (meaning this test can be used) for the duration of the COVID-19 declaration under Section 564(b)(1) of the Act, 21 U.S.C. section 360bbb-3(b)(1), unless the authorization is terminated or revoked.  Performed at [facility], [address] [place], [place] [address]      Radiology Studies:  CT HEAD WO CONTRAST ( )  Result Date: 02/15/2021 CLINICAL DATA:  Seizure, nontraumatic.  Syncopal episode. EXAM: CT HEAD WITHOUT CONTRAST TECHNIQUE: Contiguous axial images were obtained  from the base of the skull through the vertex without intravenous contrast. COMPARISON:  None. FINDINGS: Brain: The brain shows a normal appearance without evidence of malformation, atrophy, old or acute small or large vessel infarction, mass lesion, hemorrhage, hydrocephalus or extra-axial collection. Vascular: No hyperdense vessel. No evidence of atherosclerotic calcification. Skull: Normal.  No traumatic finding.  No focal bone lesion. Sinuses/Orbits: Sinuses are clear. Orbits appear normal. Mastoids are clear. Other: None significant IMPRESSION: Normal head CT. Electronically Signed   By: Paulina Fusi M.D.   On: 02/15/2021 16:38   MR BRAIN W WO CONTRAST  Result Date: 02/15/2021 CLINICAL DATA:  Seizure. EXAM: MRI HEAD WITHOUT AND WITH CONTRAST TECHNIQUE: Multiplanar, multiecho pulse sequences of the brain and surrounding structures were obtained without and with intravenous contrast. CONTRAST:  36mL GADAVIST GADOBUTROL 1 MMOL/ML IV SOLN COMPARISON:  Head CT 02/15/2021 FINDINGS: Brain: Dedicated thin section imaging through the temporal lobes demonstrates normal volume and signal of the hippocampi. There is no evidence of heterotopia or cortical dysplasia. There is no evidence of an acute infarct, intracranial hemorrhage, mass, midline shift, or extra-axial fluid collection. The ventricles and sulci are normal. The brain is normal in signal. No abnormal enhancement is identified. Vascular: Major intracranial vascular flow voids are preserved. Skull and upper cervical spine: Unremarkable bone marrow signal. Sinuses/Orbits: Unremarkable orbits. Mild mucosal thickening in the paranasal sinuses. Clear mastoid air cells. Other: None. IMPRESSION: Unremarkable appearance of the brain. Electronically Signed   By: Sebastian Ache M.D.   On: 02/15/2021 21:02     Scheduled Meds:    ARIPiprazole  10 mg Oral QHS   bacitracin   Topical BID   enoxaparin (LOVENOX) injection  40 mg Subcutaneous Q24H   nadolol  40 mg Oral  Daily    Continuous Infusions:     LOS: 0 days     Marcellus Scott, MD, [place], [facility]. [facility]    To contact the attending provider between 7A-7P or the covering provider during after hours 7P-7A, please log into the web site [email or web address] and access using universal [place] password for that web site. If you do not have the password, please call the hospital operator.  02/16/2021, 7:45 AM

## 2021-03-18 ENCOUNTER — Encounter: Payer: Self-pay | Admitting: Neurology

## 2021-03-18 ENCOUNTER — Other Ambulatory Visit: Payer: Self-pay

## 2021-03-18 ENCOUNTER — Ambulatory Visit (INDEPENDENT_AMBULATORY_CARE_PROVIDER_SITE_OTHER): Payer: Managed Care, Other (non HMO) | Admitting: Neurology

## 2021-03-18 VITALS — BP 121/80 | HR 70 | Ht 71.0 in | Wt 256.0 lb

## 2021-03-18 DIAGNOSIS — G40909 Epilepsy, unspecified, not intractable, without status epilepticus: Secondary | ICD-10-CM | POA: Diagnosis not present

## 2021-03-18 MED ORDER — LEVETIRACETAM 500 MG PO TABS: 500.0000 mg | ORAL_TABLET | Freq: Two times a day (BID) | ORAL | 2 refills | Status: DC

## 2021-03-18 MED ORDER — VITAMIN B6 50 MG PO TABS: 50.0000 mg | ORAL_TABLET | Freq: Every day | ORAL | 0 refills | Status: DC

## 2021-03-18 NOTE — Patient Instructions (Signed)
Continue with Keppra 500 mg BID  Will check a level today  Return in 6 months or sooner if worse    Per [facility] statutes, patients with seizures are not allowed to drive until they have been seizure-free for six months.  Other recommendations include using caution when using heavy equipment or power tools. Avoid working on ladders or at heights. Take showers instead of baths.  Do not swim alone.  Ensure the water temperature is not too high on the home water heater. Do not go swimming alone. Do not lock yourself in a room alone (i.e. bathroom). When caring for infants or small children, sit down when holding, feeding, or changing them to minimize risk of injury to the child in the event you have a seizure. Maintain good sleep hygiene. Avoid alcohol.  Also recommend adequate sleep, hydration, good diet and minimize stress.   During the Seizure  - First, ensure adequate ventilation and place patients on the floor on their left side  Loosen clothing around the neck and ensure the airway is patent. If the patient is clenching the teeth, do not force the mouth open with any object as this can cause severe damage - Remove all items from the surrounding that can be hazardous. The patient may be oblivious to what's happening and may not even know what he or she is doing. If the patient is confused and wandering, either gently guide him/her away and block access to outside areas - Reassure the individual and be comforting - Call 911. In most cases, the seizure ends before EMS arrives. However, there are cases when seizures may last over 3 to 5 minutes. Or the individual may have developed breathing difficulties or severe injuries. If a pregnant patient or a person with diabetes develops a seizure, it is prudent to call an ambulance. - Finally, if the patient does not regain full consciousness, then call EMS. Most patients will remain confused for about 45 to 90 minutes after a seizure, so you must  use judgment in calling for help. - Avoid restraints but make sure the patient is in a bed with padded side rails - Place the individual in a lateral position with the neck slightly flexed; this will help the saliva drain from the mouth and prevent the tongue from falling backward - Remove all nearby furniture and other hazards from the area - Provide verbal assurance as the individual is regaining consciousness - Provide the patient with privacy if possible - Call for help and start treatment as ordered by the caregiver   After the Seizure (Postictal Stage)  After a seizure, most patients experience confusion, fatigue, muscle pain and/or a headache. Thus, one should permit the individual to sleep. For the next few days, reassurance is essential. Being calm and helping reorient the person is also of importance.  Most seizures are painless and end spontaneously. Seizures are not harmful to others but can lead to complications such as stress on the lungs, brain and the heart. Individuals with prior lung problems may develop labored breathing and respiratory distress.

## 2021-03-18 NOTE — Progress Notes (Signed)
[facility]  PATIENT: William Flowers DOB: April 28, 1991  REQUESTING CLINICIAN: Elease Etienne, MD HISTORY FROM: Patient and Spouse  REASON FOR VISIT: new onset seizure   HISTORICAL  CHIEF COMPLAINT:  Chief Complaint  Patient presents with   New Patient (Initial Visit)    Pt with wife, rm 65, denies hx of seizures but on 10/18 he went to the ER with seizures. Prior to the sz he was really hot and then suddenly everything was black. His wife witnessed the event. Lasted roughly 2 min and he came to. He has not had anymore since this event.     HISTORY OF PRESENT ILLNESS:  This is a 30 year old gentleman past medical history of bipolar disorder who is presenting to the clinic after his first generalized seizure.  Patient states that on October 18 he was at his wife's job, [employer], said that he began to feel hot as asked for water, wife went to get him water and next thing that he knows is that he was on the floor with blood around his mouth and people talking around him.  He said initially he was not talking but was able to hear what was going on.  Per wife patient stood up and fell straight to the ground while making a grunting noise, wife rush to him initially he had his eyes rolled back, he was unresponsive then his body started tensing up (tonic phase) and started shaking in the upper and lower extremities.  The entire episode lasted about 2-minute then subsided.  He was taken to the emergency department.  In the ED, he had a MRI which was normal and EEG which did not show any seizure or abnormal epileptiform discharge and he was discharged the next day on Keppra 500 mg BID.  On further history patient report episode of tunnel vision happening at work usually 1-2 episode per week, they always start with tunnel vision he gets fixated on one point in his computer, afterward he felt like he lost time and will feel sick described as being nauseous.  The episodes started  in August. He did speak with the psychiatrist who changed the time when he is supposed to take his Abilify from morning to evening, stated that the tiredness decreased but he was still having this episode.  Denies any previous history of generalized seizure, no family history of seizures, denies any seizure risk factor.  Since starting the Keppra he has been seizure-free but wife reports some mild irritability. Since the seizure, Abilify was replaced by Vraglar.     Handedness: Right handed   Onset: August/September with staring spells at work   Seizure Type: Likely focal   Current frequency: last seizure Oct 18  Any injuries from seizures: Chin laceration   Seizure risk factors:  No risk factor reported   Previous ASMs: Levetiracetam   Currenty ASMs: Levetiracetam 500 mg BID   ASMs side effects: Irritability   Brain Images: Normal   Previous EEGs: Normal    OTHER MEDICAL CONDITIONS: Bipolar disorder, Depression   REVIEW OF SYSTEMS: Full 14 system review of systems performed and negative with exception of: as noted in the HPI  ALLERGIES: No Known Allergies  HOME MEDICATIONS: Outpatient Medications Prior to Visit  Medication Sig Dispense Refill   levocetirizine (XYZAL) 5 MG tablet Take 5 mg by mouth daily as needed for allergies.     nadolol (CORGARD) 40 MG tablet Take 40 mg by mouth daily.  omeprazole (PRILOSEC) 40 MG capsule 1 CAPSULE 30 MINUTES BEFORE MORNING MEAL ORALLY ONCE A DAY 90 DAYS     VRAYLAR 1.5 MG capsule Take 1.5 mg by mouth daily.     levETIRAcetam (KEPPRA) 500 MG tablet Take 1 tablet (500 mg total) by mouth 2 (two) times daily. 60 tablet 1   ARIPiprazole (ABILIFY) 10 MG tablet Take 1 tablet (10 mg total) by mouth at bedtime.     bacitracin ointment Apply topically 2 (two) times daily. Apply to wound on chin. 14 g 0   No facility-administered medications prior to visit.    PAST MEDICAL HISTORY: Past Medical History:  Diagnosis Date   Anxiety     Depression    Hypertension     PAST SURGICAL HISTORY: History reviewed. No pertinent surgical history.  FAMILY HISTORY: Family History  Problem Relation Age of Onset   Seizures Neg Hx     SOCIAL HISTORY: Social History   Socioeconomic History   Marital status: Married    Spouse name: Not on file   Number of children: Not on file   Years of education: Not on file   Highest education level: Not on file  Occupational History   Not on file  Tobacco Use   Smoking status: Never   Smokeless tobacco: Never  Vaping Use   Vaping Use: Former  Substance and Sexual Activity   Alcohol use: Yes    Comment: occasionally   Drug use: Never   Sexual activity: Not on file  Other Topics Concern   Not on file  Social History Narrative   Not on file   Social Determinants of Health   Financial Resource Strain: Not on file  Food Insecurity: Not on file  Transportation Needs: Not on file  Physical Activity: Not on file  Stress: Not on file  Social Connections: Not on file  Intimate Partner Violence: Not on file    PHYSICAL EXAM  GENERAL EXAM/CONSTITUTIONAL: Vitals:  Vitals:   03/18/21 0854  BP: 121/80  Pulse: 70  Weight: 256 lb (116.1 kg)  Height: 5\' 11"  (1.803 m)   Body mass index is 35.7 kg/m. Wt Readings from Last 3 Encounters:  03/18/21 256 lb (116.1 kg)  02/15/21 250 lb (113.4 kg)   Patient is in no distress; well developed, nourished and groomed; neck is supple  CARDIOVASCULAR: Examination of carotid arteries is normal; no carotid bruits Regular rate and rhythm, no murmurs Examination of peripheral vascular system by observation and palpation is normal  EYES: Pupils round and reactive to light, Visual fields full to confrontation, Extraocular movements intacts,  No results found.  MUSCULOSKELETAL: Gait, strength, tone, movements noted in Neurologic exam below  NEUROLOGIC: MENTAL STATUS:  No flowsheet data found. awake, alert, oriented to person, place  and time recent and remote memory intact normal attention and concentration language fluent, comprehension intact, naming intact fund of knowledge appropriate  CRANIAL NERVE:  2nd, 3rd, 4th, 6th - pupils equal and reactive to light, visual fields full to confrontation, extraocular muscles intact, no nystagmus 5th - facial sensation symmetric 7th - facial strength symmetric 8th - hearing intact 9th - palate elevates symmetrically, uvula midline 11th - shoulder shrug symmetric 12th - tongue protrusion midline  MOTOR:  normal bulk and tone, full strength in the BUE, BLE  SENSORY:  normal and symmetric to light touch, pinprick, temperature, vibration  COORDINATION:  finger-nose-finger, fine finger movements normal  REFLEXES:  deep tendon reflexes present and symmetric  GAIT/STATION:  normal  DIAGNOSTIC DATA (LABS, IMAGING, TESTING) - I reviewed patient records, labs, notes, testing and imaging myself where available.  Lab Results  Component Value Date   WBC 9.4 02/16/2021   HGB 16.1 02/16/2021   HCT 46.5 02/16/2021   MCV 88.9 02/16/2021   PLT 245 02/16/2021      Component Value Date/Time   NA 136 02/16/2021 0545   K 3.8 02/16/2021 0545   CL 105 02/16/2021 0545   CO2 25 02/16/2021 0545   GLUCOSE 86 02/16/2021 0545   BUN 13 02/16/2021 0545   CREATININE 0.93 02/16/2021 0545   CALCIUM 8.9 02/16/2021 0545   GFRNONAA >60 02/16/2021 0545   No results found for: CHOL, HDL, LDLCALC, LDLDIRECT, TRIG No results found for: HGBA1C No results found for: VITAMINB12 No results found for: TSH  MRI Brain  Unremarkable appearance of the brain  EEG: This study is within normal limits. No seizures or epileptiform discharges were seen throughout the recording   I personally reviewed brain Images and previous EEG reports.   ASSESSMENT AND PLAN  30 y.o. year old male  with past medical history of bipolar disorder who is presenting with his first lifetime generalized  seizure.  Patient also reported episodes of tunnel vision, losing track of time and feeling sick after work 2 to 3 months prior to his first generalized seizure.  He was started on Keppra 500 mg twice daily, since then no additional generalized seizure and no additional tunnel vision episodes but wife has reported mild irritability.  With his history of bipolar disorder I do think valproic acid might be the best medication for him, I will still continue him on levetiracetam 500 mg twice daily with supplement B6, and he will discuss with his psychiatrist if agreeable with switching him to valproic acid.  Discussed side effect of valproic acid including weight gain and elevated liver enzymes.  I have advised the patient to contact me as soon as he has another seizure: focal unaware or generalized seizure, at that time I will most likely switch him to valproic acid rather than increasing his levetiracetam.  I will see him in 6 months for follow-up, return sooner if worse. Discussed driving restriction for at least 6 months, he is aware.     1. Seizure disorder (HCC)     PLAN: Continue with Keppra 500 mg BID  Will check a level today  Return in 6 months or sooner if worse   Per [facility] statutes, patients with seizures are not allowed to drive until they have been seizure-free for six months.  Other recommendations include using caution when using heavy equipment or power tools. Avoid working on ladders or at heights. Take showers instead of baths.  Do not swim alone.  Ensure the water temperature is not too high on the home water heater. Do not go swimming alone. Do not lock yourself in a room alone (i.e. bathroom). When caring for infants or small children, sit down when holding, feeding, or changing them to minimize risk of injury to the child in the event you have a seizure. Maintain good sleep hygiene. Avoid alcohol.  Also recommend adequate sleep, hydration, good diet and minimize  stress.   During the Seizure  - First, ensure adequate ventilation and place patients on the floor on their left side  Loosen clothing around the neck and ensure the airway is patent. If the patient is clenching the teeth, do not force the mouth open with any object as this  can cause severe damage - Remove all items from the surrounding that can be hazardous. The patient may be oblivious to what's happening and may not even know what he or she is doing. If the patient is confused and wandering, either gently guide him/her away and block access to outside areas - Reassure the individual and be comforting - Call 911. In most cases, the seizure ends before EMS arrives. However, there are cases when seizures may last over 3 to 5 minutes. Or the individual may have developed breathing difficulties or severe injuries. If a pregnant patient or a person with diabetes develops a seizure, it is prudent to call an ambulance. - Finally, if the patient does not regain full consciousness, then call EMS. Most patients will remain confused for about 45 to 90 minutes after a seizure, so you must use judgment in calling for help. - Avoid restraints but make sure the patient is in a bed with padded side rails - Place the individual in a lateral position with the neck slightly flexed; this will help the saliva drain from the mouth and prevent the tongue from falling backward - Remove all nearby furniture and other hazards from the area - Provide verbal assurance as the individual is regaining consciousness - Provide the patient with privacy if possible - Call for help and start treatment as ordered by the caregiver   After the Seizure (Postictal Stage)  After a seizure, most patients experience confusion, fatigue, muscle pain and/or a headache. Thus, one should permit the individual to sleep. For the next few days, reassurance is essential. Being calm and helping reorient the person is also of importance.  Most  seizures are painless and end spontaneously. Seizures are not harmful to others but can lead to complications such as stress on the lungs, brain and the heart. Individuals with prior lung problems may develop labored breathing and respiratory distress.     Orders Placed This Encounter  Procedures   Levetiracetam level    Meds ordered this encounter  Medications   levETIRAcetam (KEPPRA) 500 MG tablet    Sig: Take 1 tablet (500 mg total) by mouth 2 (two) times daily.    Dispense:  60 tablet    Refill:  2   Pyridoxine HCl (VITAMIN B6) 50 MG TABS    Sig: Take 50 mg by mouth daily.    Dispense:  60 tablet    Refill:  0    Return in about 6 months (around 09/15/2021).    Windell Norfolk, MD 03/18/2021, 9:35 AM  [facility] [address] [place], [place] [address] [phone]

## 2021-03-22 LAB — LEVETIRACETAM LEVEL: Levetiracetam Lvl: 12.3 ug/mL (ref 10.0–40.0)

## 2021-06-12 ENCOUNTER — Other Ambulatory Visit: Payer: Self-pay | Admitting: Neurology

## 2021-07-18 ENCOUNTER — Other Ambulatory Visit: Payer: Self-pay | Admitting: Neurology

## 2021-07-18 NOTE — Telephone Encounter (Signed)
Rx refilled.

## 2021-08-19 ENCOUNTER — Other Ambulatory Visit: Payer: Self-pay | Admitting: Neurology

## 2021-09-20 ENCOUNTER — Ambulatory Visit: Payer: Managed Care, Other (non HMO) | Admitting: Neurology

## 2021-09-20 ENCOUNTER — Encounter: Payer: Self-pay | Admitting: Neurology

## 2021-09-20 VITALS — BP 133/90 | HR 81 | Ht 70.0 in | Wt 261.0 lb

## 2021-09-20 DIAGNOSIS — G40909 Epilepsy, unspecified, not intractable, without status epilepticus: Secondary | ICD-10-CM

## 2021-09-20 MED ORDER — LEVETIRACETAM 500 MG PO TABS: 500.0000 mg | ORAL_TABLET | Freq: Two times a day (BID) | ORAL | 4 refills | Status: DC

## 2021-09-20 NOTE — Patient Instructions (Signed)
Continue with Keppra 500 mg BID, refill given  Return in 1 year, at that time, we will discuss about coming off Keppra.

## 2021-09-20 NOTE — Progress Notes (Signed)
[facility]  PATIENT: William Flowers DOB: 12-22-1990  REQUESTING CLINICIAN: Mitzi Hansen, NP HISTORY FROM: Patient and Spouse  REASON FOR VISIT: new onset seizure   HISTORICAL  CHIEF COMPLAINT:  Chief Complaint  Patient presents with   Follow-up    Room 12, alone States he is stable, reports no sz, doing well on medication     INTERVAL HISTORY 09/20/2021:  Patient presents today for follow-up, last visit was in November, since that time he denies any additional seizures.  He is compliant with Keppra 500 mg twice daily, reported mild side effect of irritability but his psychiatrist increased his Vraylar to 300 mg daily.  Reports that he is doing well.  He started driving, no additional complaints or additional concerns.  His Keppra level was normal at 12.3.   HISTORY OF PRESENT ILLNESS:  This is a 31 year old gentleman past medical history of bipolar disorder who is presenting to the clinic after his first generalized seizure.  Patient states that on October 18 he was at his wife's job, [employer], said that he began to feel hot as asked for water, wife went to get him water and next thing that he knows is that he was on the floor with blood around his mouth and people talking around him.  He said initially he was not talking but was able to hear what was going on.  Per wife patient stood up and fell straight to the ground while making a grunting noise, wife rush to him initially he had his eyes rolled back, he was unresponsive then his body started tensing up (tonic phase) and started shaking in the upper and lower extremities.  The entire episode lasted about 2-minute then subsided.  He was taken to the emergency department.  In the ED, he had a MRI which was normal and EEG which did not show any seizure or abnormal epileptiform discharge and he was discharged the next day on Keppra 500 mg BID.  On further history patient report episode of tunnel vision happening  at work usually 1-2 episode per week, they always start with tunnel vision he gets fixated on one point in his computer, afterward he felt like he lost time and will feel sick described as being nauseous.  The episodes started in August. He did speak with the psychiatrist who changed the time when he is supposed to take his Abilify from morning to evening, stated that the tiredness decreased but he was still having this episode.  Denies any previous history of generalized seizure, no family history of seizures, denies any seizure risk factor.  Since starting the Keppra he has been seizure-free but wife reports some mild irritability. Since the seizure, Abilify was replaced by Vraglar.    Handedness: Right handed   Onset: August/September with staring spells at work   Seizure Type: Likely focal   Current frequency: last seizure Oct 18  Any injuries from seizures: Chin laceration   Seizure risk factors:  No risk factor reported   Previous ASMs: Levetiracetam   Currenty ASMs: Levetiracetam 500 mg BID   ASMs side effects: Irritability   Brain Images: Normal   Previous EEGs: Normal    OTHER MEDICAL CONDITIONS: Bipolar disorder, Depression   REVIEW OF SYSTEMS: Full 14 system review of systems performed and negative with exception of: as noted in the HPI  ALLERGIES: No Known Allergies  HOME MEDICATIONS: Outpatient Medications Prior to Visit  Medication Sig Dispense Refill   levocetirizine (XYZAL) 5 MG  tablet Take 5 mg by mouth daily as needed for allergies.     nadolol (CORGARD) 40 MG tablet Take 40 mg by mouth daily.     omeprazole (PRILOSEC) 40 MG capsule 1 CAPSULE 30 MINUTES BEFORE MORNING MEAL ORALLY ONCE A DAY 90 DAYS     pyridOXINE (VITAMIN B-6) 50 MG tablet TAKE 50 MG BY MOUTH DAILY. 60 tablet 0   VRAYLAR 1.5 MG capsule Take 1.5 mg by mouth daily.     levETIRAcetam (KEPPRA) 500 MG tablet TAKE 1 TABLET BY MOUTH TWICE A DAY 180 tablet 1   No facility-administered medications  prior to visit.    PAST MEDICAL HISTORY: Past Medical History:  Diagnosis Date   Anxiety    Depression    Hypertension     PAST SURGICAL HISTORY: No past surgical history on file.  FAMILY HISTORY: Family History  Problem Relation Age of Onset   Seizures Neg Hx     SOCIAL HISTORY: Social History   Socioeconomic History   Marital status: Married    Spouse name: Not on file   Number of children: Not on file   Years of education: Not on file   Highest education level: Not on file  Occupational History   Not on file  Tobacco Use   Smoking status: Never   Smokeless tobacco: Never  Vaping Use   Vaping Use: Former  Substance and Sexual Activity   Alcohol use: Yes    Comment: occasionally   Drug use: Never   Sexual activity: Not on file  Other Topics Concern   Not on file  Social History Narrative   Not on file   Social Determinants of Health   Financial Resource Strain: Not on file  Food Insecurity: Not on file  Transportation Needs: Not on file  Physical Activity: Not on file  Stress: Not on file  Social Connections: Not on file  Intimate Partner Violence: Not on file    PHYSICAL EXAM  GENERAL EXAM/CONSTITUTIONAL: Vitals:  Vitals:   09/20/21 0858  BP: 133/90  Pulse: 81  Weight: 261 lb (118.4 kg)  Height:  (1.778 m)    Body mass index is 37.45 kg/m. Wt Readings from Last 3 Encounters:  09/20/21 261 lb (118.4 kg)  03/18/21 256 lb (116.1 kg)  02/15/21 250 lb (113.4 kg)   Patient is in no distress; well developed, nourished and groomed; neck is supple  EYES: Pupils round and reactive to light, Visual fields full to confrontation, Extraocular movements intacts,  No results found.  MUSCULOSKELETAL: Gait, strength, tone, movements noted in Neurologic exam below  NEUROLOGIC: MENTAL STATUS:      View : No data to display.         awake, alert, oriented to person, place and time recent and remote memory intact normal attention and  concentration language fluent, comprehension intact, naming intact fund of knowledge appropriate  CRANIAL NERVE:  2nd, 3rd, 4th, 6th - pupils equal and reactive to light, visual fields full to confrontation, extraocular muscles intact, no nystagmus 5th - facial sensation symmetric 7th - facial strength symmetric 8th - hearing intact 9th - palate elevates symmetrically, uvula midline 11th - shoulder shrug symmetric 12th - tongue protrusion midline  MOTOR:  normal bulk and tone, full strength in the BUE, BLE  SENSORY:  normal and symmetric to light touch, pinprick, temperature, vibration  COORDINATION:  finger-nose-finger, fine finger movements normal   GAIT/STATION:  normal    DIAGNOSTIC DATA (LABS, IMAGING, TESTING) - I  reviewed patient records, labs, notes, testing and imaging myself where available.  Lab Results  Component Value Date   WBC 9.4 02/16/2021   HGB 16.1 02/16/2021   HCT 46.5 02/16/2021   MCV 88.9 02/16/2021   PLT 245 02/16/2021      Component Value Date/Time   NA 136 02/16/2021 0545   K 3.8 02/16/2021 0545   CL 105 02/16/2021 0545   CO2 25 02/16/2021 0545   GLUCOSE 86 02/16/2021 0545   BUN 13 02/16/2021 0545   CREATININE 0.93 02/16/2021 0545   CALCIUM 8.9 02/16/2021 0545   GFRNONAA >60 02/16/2021 0545   No results found for: CHOL, HDL, LDLCALC, LDLDIRECT, TRIG No results found for: HGBA1C No results found for: VITAMINB12 No results found for: TSH  Levetiracetam level, March 18, 2021:    12.3  MRI Brain  Unremarkable appearance of the brain  EEG: This study is within normal limits. No seizures or epileptiform discharges were seen throughout the recording   I personally reviewed brain Images and previous EEG reports.   ASSESSMENT AND PLAN  31 y.o. year old male  with past medical history of bipolar disorder who is presenting for follow up for his onetime seizures and episodes of losing track of time.  He is doing well on Keppra,  denies any additional seizures or any other events.  Today he reports that he is not sure if the event that he had was a seizure versus a convulsive syncope, this was his only event.  He is thinking about coming off Keppra.  I informed patient that it is reasonable to continue Keppra for another year and if no additional seizures, since this was his only generalized seizure, I think it is reasonable to do a trial of coming off medication.  Also informed patient during this time he will not be allowed to drive for 726-months while he is off the medication.   At next visit in 1 year he will decide if he wants to come off medication if no additional seizures.  Follow-up in 1 year.      1. Seizure disorder (HCC)      PLAN: Continue with Keppra 500 mg BID  Return in 1 year, at that time, we will discuss about coming off Keppra.   [place], patients with seizures are not allowed to drive until they have been seizure-free for six months.  Other recommendations include using caution when using heavy equipment or power tools. Avoid working on ladders or at heights. Take showers instead of baths.  Do not swim alone.  Ensure the water temperature is not too high on the home water heater. Do not go swimming alone. Do not lock yourself in a room alone (i.e. bathroom). When caring for infants or small children, sit down when holding, feeding, or changing them to minimize risk of injury to the child in the event you have a seizure. Maintain good sleep hygiene. Avoid alcohol.  Also recommend adequate sleep, hydration, good diet and minimize stress.   During the Seizure  - First, ensure adequate ventilation and place patients on the floor on their left side  Loosen clothing around the neck and ensure the airway is patent. If the patient is clenching the teeth, do not force the mouth open with any object as this can cause severe damage - Remove all items from the surrounding that can be  hazardous. The patient may be oblivious to what's happening and may not even know what  he or she is doing. If the patient is confused and wandering, either gently guide him/her away and block access to outside areas - Reassure the individual and be comforting - Call 911. In most cases, the seizure ends before EMS arrives. However, there are cases when seizures may last over 3 to 5 minutes. Or the individual may have developed breathing difficulties or severe injuries. If a pregnant patient or a person with diabetes develops a seizure, it is prudent to call an ambulance. - Finally, if the patient does not regain full consciousness, then call EMS. Most patients will remain confused for about 45 to 90 minutes after a seizure, so you must use judgment in calling for help. - Avoid restraints but make sure the patient is in a bed with padded side rails - Place the individual in a lateral position with the neck slightly flexed; this will help the saliva drain from the mouth and prevent the tongue from falling backward - Remove all nearby furniture and other hazards from the area - Provide verbal assurance as the individual is regaining consciousness - Provide the patient with privacy if possible - Call for help and start treatment as ordered by the caregiver   After the Seizure (Postictal Stage)  After a seizure, most patients experience confusion, fatigue, muscle pain and/or a headache. Thus, one should permit the individual to sleep. For the next few days, reassurance is essential. Being calm and helping reorient the person is also of importance.  Most seizures are painless and end spontaneously. Seizures are not harmful to others but can lead to complications such as stress on the lungs, brain and the heart. Individuals with prior lung problems may develop labored breathing and respiratory distress.     No orders of the defined types were placed in this encounter.   Meds ordered this encounter   Medications   levETIRAcetam (KEPPRA) 500 MG tablet    Sig: Take 1 tablet (500 mg total) by mouth 2 (two) times daily.    Dispense:  180 tablet    Refill:  4    Return in about 1 year (around 09/21/2022).  I have spent a total of 30 minutes dedicated to this patient today, preparing to see patient, performing a medically appropriate examination and evaluation, ordering tests and/or medications and procedures, and counseling and educating the patient/family/caregiver; independently interpreting result and communicating results to the family/patient/caregiver; and documenting clinical information in the electronic medical record.   Windell Norfolk, MD 09/20/2021, 9:16 AM  [facility] [address] [place], [place] [address] [phone]

## 2022-09-21 ENCOUNTER — Ambulatory Visit: Payer: Managed Care, Other (non HMO) | Admitting: Neurology

## 2022-09-21 ENCOUNTER — Encounter: Payer: Self-pay | Admitting: Neurology

## 2022-09-21 VITALS — BP 138/88 | Ht 71.0 in | Wt 246.0 lb

## 2022-09-21 DIAGNOSIS — G40909 Epilepsy, unspecified, not intractable, without status epilepticus: Secondary | ICD-10-CM | POA: Diagnosis not present

## 2022-09-21 MED ORDER — LEVETIRACETAM 500 MG PO TABS: 500.0000 mg | ORAL_TABLET | Freq: Two times a day (BID) | ORAL | 4 refills | Status: DC

## 2022-09-21 NOTE — Progress Notes (Signed)
[facility]  PATIENT: William Flowers DOB: March 14, 1991  REQUESTING CLINICIAN: Mitzi Hansen, NP HISTORY FROM: Patient  REASON FOR VISIT: seizure follow up   HISTORICAL  CHIEF COMPLAINT:  Chief Complaint  Patient presents with   Follow-up    Rm 13, f/u sz, no sz since last visit, no missed medication doses   INTERVAL HISTORY 09/21/2022:  Patient presents today for follow-up, last visit was a year ago.  Since then, he has been doing well no seizure or seizure-like activity.  He is compliant with his Keppra 500 mg twice daily, denies any side effect.  His psychiatrist started him on lamotrigine, he reports less agitation with the lamotrigine.  He is undergoing some stressors right now, going thru a divorce but overall he is doing very well.  He would like to stay on the Keppra for another year.    INTERVAL HISTORY 09/20/2021:  Patient presents today for follow-up, last visit was in November, since that time he denies any additional seizures.  He is compliant with Keppra 500 mg twice daily, reported mild side effect of irritability but his psychiatrist increased his Vraylar to 300 mg daily.  Reports that he is doing well.  He started driving, no additional complaints or additional concerns.  His Keppra level was normal at 12.3.   HISTORY OF PRESENT ILLNESS:  This is a 32 year old gentleman past medical history of bipolar disorder who is presenting to the clinic after his first generalized seizure.  Patient states that on October 18 he was at his wife's job, [employer], said that he began to feel hot as asked for water, wife went to get him water and next thing that he knows is that he was on the floor with blood around his mouth and people talking around him.  He said initially he was not talking but was able to hear what was going on.  Per wife patient stood up and fell straight to the ground while making a grunting noise, wife rush to him initially he had his eyes  rolled back, he was unresponsive then his body started tensing up (tonic phase) and started shaking in the upper and lower extremities.  The entire episode lasted about 2-minute then subsided.  He was taken to the emergency department.  In the ED, he had a MRI which was normal and EEG which did not show any seizure or abnormal epileptiform discharge and he was discharged the next day on Keppra 500 mg BID.  On further history patient report episode of tunnel vision happening at work usually 1-2 episode per week, they always start with tunnel vision he gets fixated on one point in his computer, afterward he felt like he lost time and will feel sick described as being nauseous.  The episodes started in August. He did speak with the psychiatrist who changed the time when he is supposed to take his Abilify from morning to evening, stated that the tiredness decreased but he was still having this episode.  Denies any previous history of generalized seizure, no family history of seizures, denies any seizure risk factor.  Since starting the Keppra he has been seizure-free but wife reports some mild irritability. Since the seizure, Abilify was replaced by Vraglar.    Handedness: Right handed   Onset: August/September with staring spells at work   Seizure Type: Likely focal   Current frequency: last seizure Oct 18  Any injuries from seizures: Chin laceration   Seizure risk factors:  No risk  factor reported   Previous ASMs: Levetiracetam   Currenty ASMs: Levetiracetam 500 mg BID   ASMs side effects: Irritability   Brain Images: Normal   Previous EEGs: Normal    OTHER MEDICAL CONDITIONS: Bipolar disorder, Depression   REVIEW OF SYSTEMS: Full 14 system review of systems performed and negative with exception of: as noted in the HPI  ALLERGIES: No Known Allergies  HOME MEDICATIONS: Outpatient Medications Prior to Visit  Medication Sig Dispense Refill   lamoTRIgine (LAMICTAL PO) Take by mouth  daily. Psychiatrist prescribed, unsure of dose     nadolol (CORGARD) 40 MG tablet Take 40 mg by mouth daily.     VRAYLAR 1.5 MG capsule Take 3 mg by mouth daily.     levETIRAcetam (KEPPRA) 500 MG tablet Take 1 tablet (500 mg total) by mouth 2 (two) times daily. 180 tablet 4   levocetirizine (XYZAL) 5 MG tablet Take 5 mg by mouth daily as needed for allergies. (Patient not taking: Reported on 09/21/2022)     omeprazole (PRILOSEC) 40 MG capsule 1 CAPSULE 30 MINUTES BEFORE MORNING MEAL ORALLY ONCE A DAY 90 DAYS (Patient not taking: Reported on 09/21/2022)     pyridOXINE (VITAMIN B-6) 50 MG tablet TAKE 50 MG BY MOUTH DAILY. (Patient not taking: Reported on 09/21/2022) 60 tablet 0   No facility-administered medications prior to visit.    PAST MEDICAL HISTORY: Past Medical History:  Diagnosis Date   Anxiety    Bipolar 2 disorder (HCC)    Depression    Hypertension    Seizures (HCC)     PAST SURGICAL HISTORY: History reviewed. No pertinent surgical history.  FAMILY HISTORY: Family History  Problem Relation Age of Onset   Seizures Neg Hx     SOCIAL HISTORY: Social History   Socioeconomic History   Marital status: Married    Spouse name: Not on file   Number of children: Not on file   Years of education: Not on file   Highest education level: Not on file  Occupational History   Not on file  Tobacco Use   Smoking status: Never   Smokeless tobacco: Never  Vaping Use   Vaping Use: Former  Substance and Sexual Activity   Alcohol use: Yes    Comment: occasionally   Drug use: Never   Sexual activity: Yes  Other Topics Concern   Not on file  Social History Narrative   Lives with friend   Right handed   Caffeine 16-24oz daily   Social Determinants of Health   Financial Resource Strain: Not on file  Food Insecurity: Not on file  Transportation Needs: Not on file  Physical Activity: Not on file  Stress: Not on file  Social Connections: Not on file  Intimate Partner  Violence: Not on file    PHYSICAL EXAM  GENERAL EXAM/CONSTITUTIONAL: Vitals:  Vitals:   09/21/22 0856  BP: 138/88  Weight: 246 lb (111.6 kg)  Height: 5\' 11"  (1.803 m)    Body mass index is 34.31 kg/m. Wt Readings from Last 3 Encounters:  09/21/22 246 lb (111.6 kg)  09/20/21 261 lb (118.4 kg)  03/18/21 256 lb (116.1 kg)   Patient is in no distress; well developed, nourished and groomed; neck is supple  MUSCULOSKELETAL: Gait, strength, tone, movements noted in Neurologic exam below  NEUROLOGIC: MENTAL STATUS:      No data to display         awake, alert, oriented to person, place and time recent and remote memory intact  normal attention and concentration language fluent, comprehension intact, naming intact fund of knowledge appropriate  CRANIAL NERVE:  2nd, 3rd, 4th, 6th - visual fields full to confrontation, extraocular muscles intact, no nystagmus 5th - facial sensation symmetric 7th - facial strength symmetric 8th - hearing intact 9th - palate elevates symmetrically, uvula midline 11th - shoulder shrug symmetric 12th - tongue protrusion midline  MOTOR:  normal bulk and tone, full strength in the BUE, BLE  SENSORY:  normal and symmetric to light touch, pinprick, temperature, vibration  COORDINATION:  finger-nose-finger, fine finger movements normal   GAIT/STATION:  normal    DIAGNOSTIC DATA (LABS, IMAGING, TESTING) - I reviewed patient records, labs, notes, testing and imaging myself where available.  Lab Results  Component Value Date   WBC 9.4 02/16/2021   HGB 16.1 02/16/2021   HCT 46.5 02/16/2021   MCV 88.9 02/16/2021   PLT 245 02/16/2021      Component Value Date/Time   NA 136 02/16/2021 0545   K 3.8 02/16/2021 0545   CL 105 02/16/2021 0545   CO2 25 02/16/2021 0545   GLUCOSE 86 02/16/2021 0545   BUN 13 02/16/2021 0545   CREATININE 0.93 02/16/2021 0545   CALCIUM 8.9 02/16/2021 0545   GFRNONAA >60 02/16/2021 0545   No results  found for: "CHOL", "HDL", "LDLCALC", "LDLDIRECT", "TRIG" No results found for: "HGBA1C" No results found for: "VITAMINB12" No results found for: "TSH"  Levetiracetam level, March 18, 2021:    12.3  MRI Brain  Unremarkable appearance of the brain  EEG: This study is within normal limits. No seizures or epileptiform discharges were seen throughout the recording   I personally reviewed brain Images and previous EEG reports.   ASSESSMENT AND PLAN  32 y.o. year old male  with past medical history of bipolar disorder and seizure who is presenting for follow up.  He is doing well on Keppra 500 mg twice daily, denies any side effect.  His psychiatrist also started him on lamotrigine and he is doing well less agitation.  Plan for now is to continue with Keppra 500 mg twice daily.  I will see him in 1 year for follow-up.    1. Seizure disorder [facility])      Patient Instructions  Continue with Keppra 500 mg twice daily, refill given Continue other medication Follow-up in 1 year or sooner if worse.    Per [facility] statutes, patients with seizures are not allowed to drive until they have been seizure-free for six months.  Other recommendations include using caution when using heavy equipment or power tools. Avoid working on ladders or at heights. Take showers instead of baths.  Do not swim alone.  Ensure the water temperature is not too high on the home water heater. Do not go swimming alone. Do not lock yourself in a room alone (i.e. bathroom). When caring for infants or small children, sit down when holding, feeding, or changing them to minimize risk of injury to the child in the event you have a seizure. Maintain good sleep hygiene. Avoid alcohol.  Also recommend adequate sleep, hydration, good diet and minimize stress.   During the Seizure  - First, ensure adequate ventilation and place patients on the floor on their left side  Loosen clothing around the neck and ensure the  airway is patent. If the patient is clenching the teeth, do not force the mouth open with any object as this can cause severe damage - Remove all items from the surrounding  that can be hazardous. The patient may be oblivious to what's happening and may not even know what he or she is doing. If the patient is confused and wandering, either gently guide him/her away and block access to outside areas - Reassure the individual and be comforting - Call 911. In most cases, the seizure ends before EMS arrives. However, there are cases when seizures may last over 3 to 5 minutes. Or the individual may have developed breathing difficulties or severe injuries. If a pregnant patient or a person with diabetes develops a seizure, it is prudent to call an ambulance. - Finally, if the patient does not regain full consciousness, then call EMS. Most patients will remain confused for about 45 to 90 minutes after a seizure, so you must use judgment in calling for help. - Avoid restraints but make sure the patient is in a bed with padded side rails - Place the individual in a lateral position with the neck slightly flexed; this will help the saliva drain from the mouth and prevent the tongue from falling backward - Remove all nearby furniture and other hazards from the area - Provide verbal assurance as the individual is regaining consciousness - Provide the patient with privacy if possible - Call for help and start treatment as ordered by the caregiver   After the Seizure (Postictal Stage)  After a seizure, most patients experience confusion, fatigue, muscle pain and/or a headache. Thus, one should permit the individual to sleep. For the next few days, reassurance is essential. Being calm and helping reorient the person is also of importance.  Most seizures are painless and end spontaneously. Seizures are not harmful to others but can lead to complications such as stress on the lungs, brain and the heart. Individuals  with prior lung problems may develop labored breathing and respiratory distress.     No orders of the defined types were placed in this encounter.   Meds ordered this encounter  Medications   levETIRAcetam (KEPPRA) 500 MG tablet    Sig: Take 1 tablet (500 mg total) by mouth 2 (two) times daily.    Dispense:  180 tablet    Refill:  4    Return in about 1 year (around 09/21/2023).    Windell Norfolk, MD 09/21/2022, 9:13 AM  [facility] [address] [place], [place] [address] [phone]

## 2022-09-21 NOTE — Patient Instructions (Signed)
Continue with Keppra 500 mg twice daily, refill given Continue other medication Follow-up in 1 year or sooner if worse.

## 2023-01-11 IMAGING — CT CT HEAD W/O CM
3 series · 15 of 47 positions shown, 18 images · non-contrast
Comparison: None.

CLINICAL DATA: Seizure, nontraumatic.  Syncopal episode.

EXAM:
CT HEAD WITHOUT CONTRAST
TECHNIQUE: Contiguous axial images were obtained from the base of the skull
through the vertex without intravenous contrast.

[Series 2: head wo · axial · 0.46mm/px · z∈[-170,-45]mm · 9 of 31 slices shown, 12 images]
[im 3/31  brain]
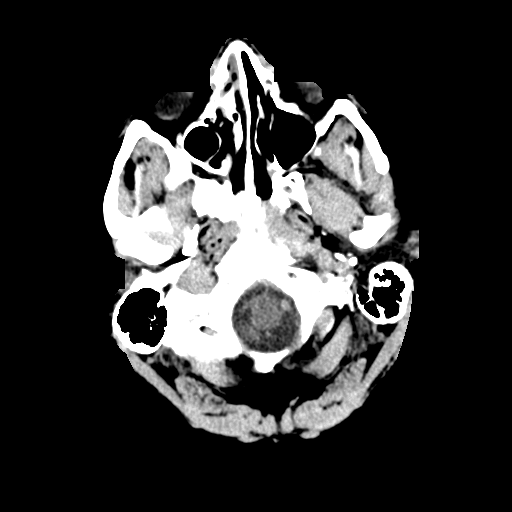
[im 3/31  bone]
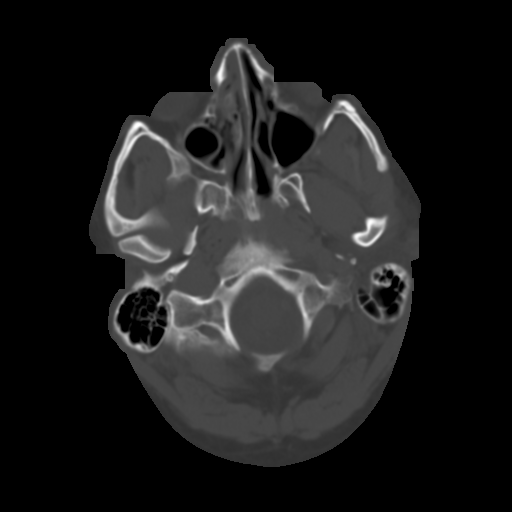
[im 6/31  brain]
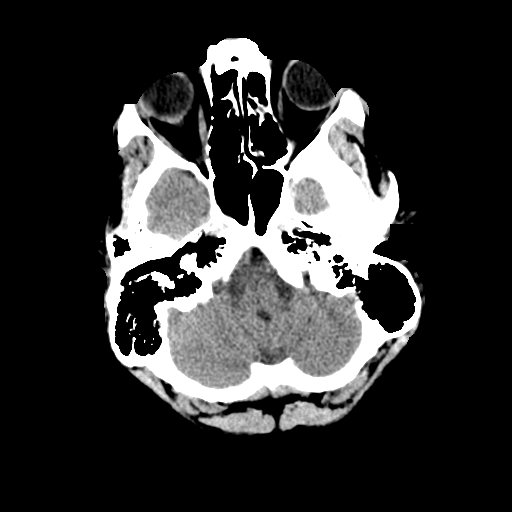
[im 9/31  brain]
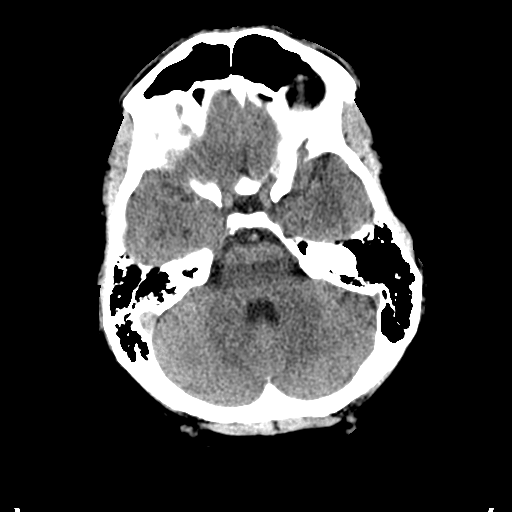
[im 12/31  brain]
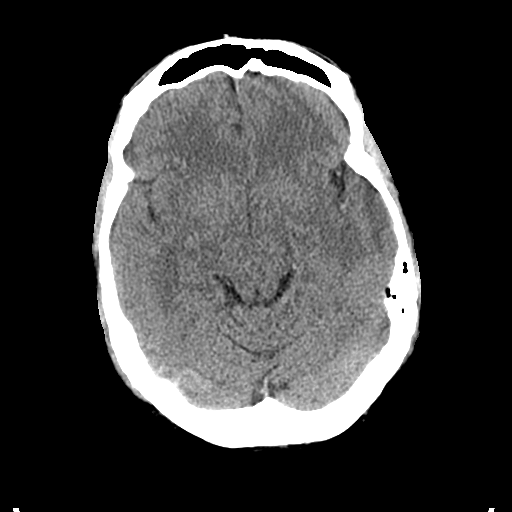
[im 16/31  brain]
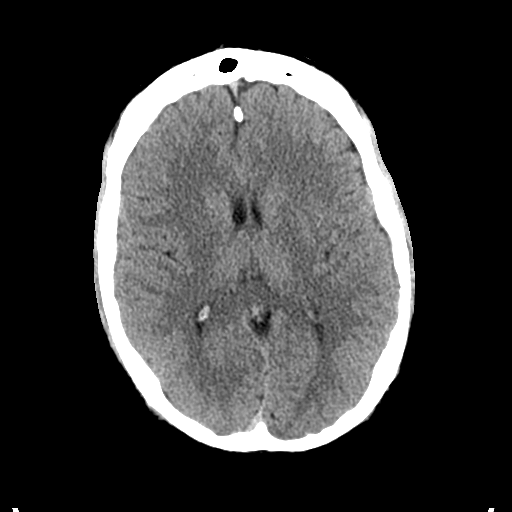
[im 16/31  bone]
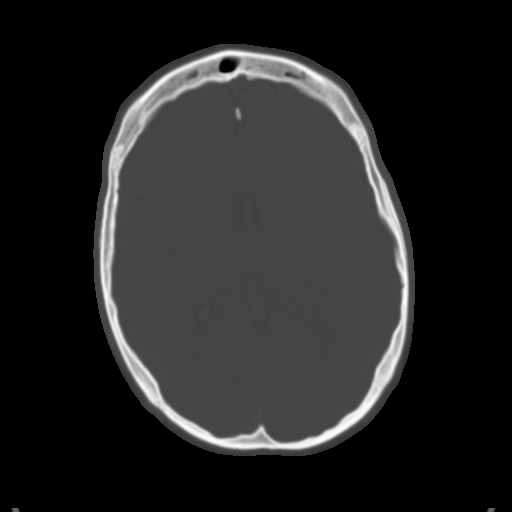
[im 19/31  brain]
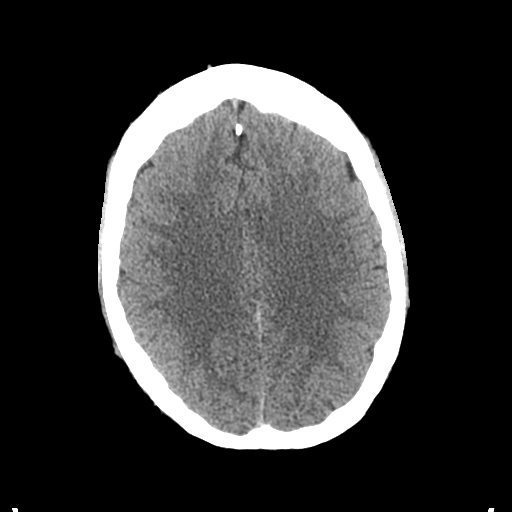
[im 22/31  brain]
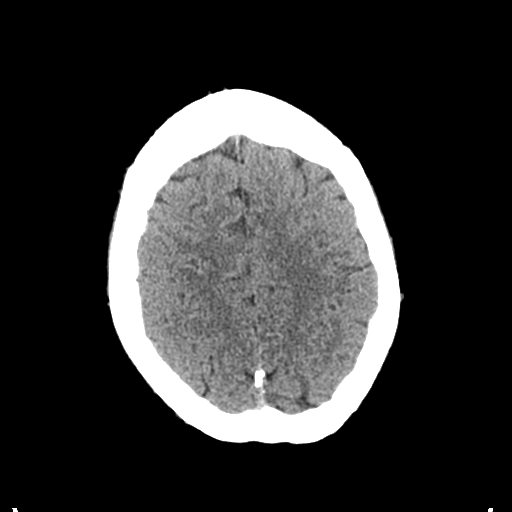
[im 25/31  brain]
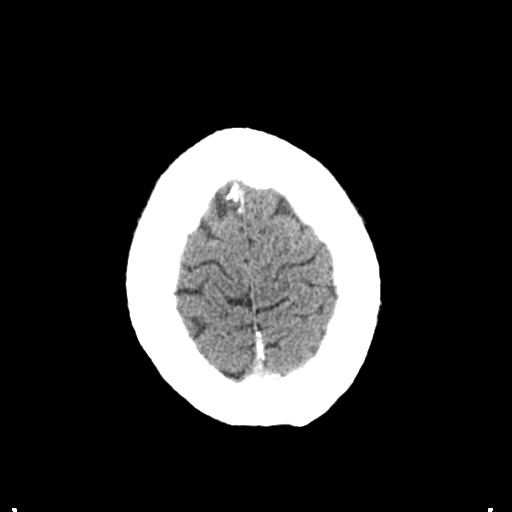
[im 28/31  brain]
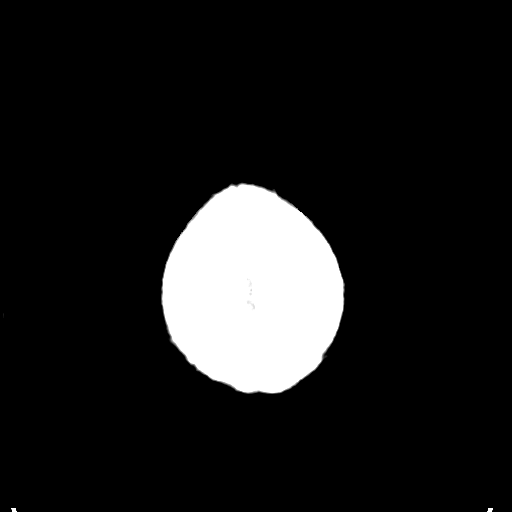
[im 28/31  bone]
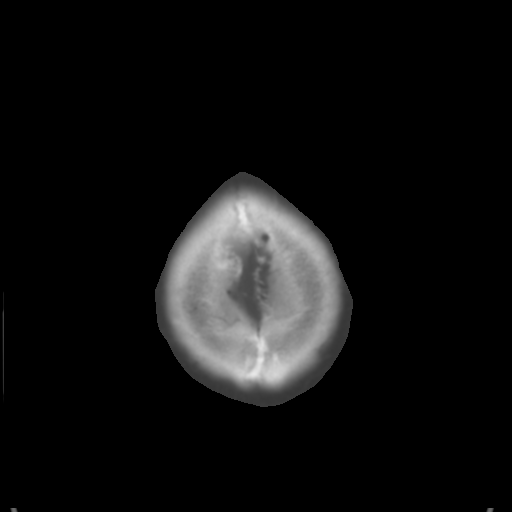

[Series 5: coronal soft tissue · coronal · 0.32mm/px · 3 of 73 slices shown]
[im 25/73  brain]
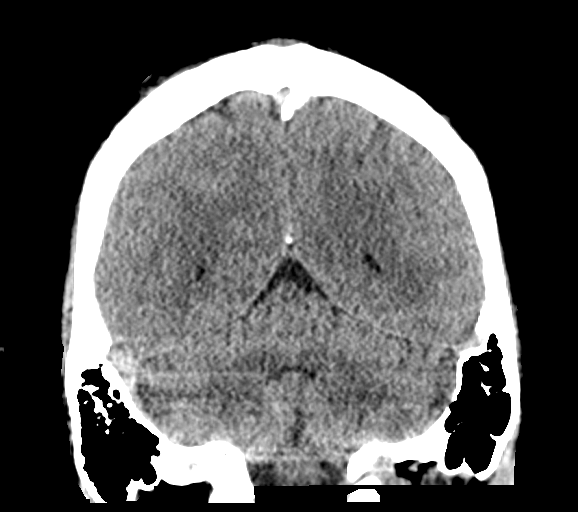
[im 33/73  brain]
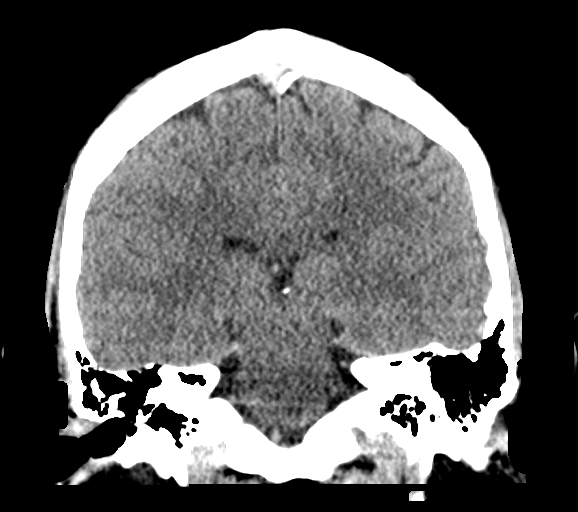
[im 41/73  brain]
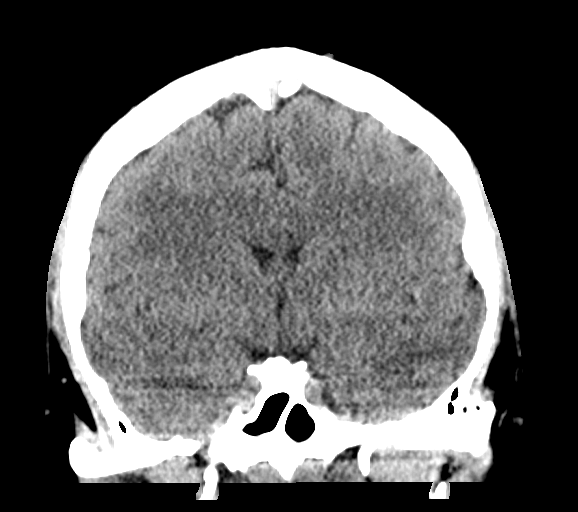

[Series 6: sagittal soft tissue · sagittal · 0.31mm/px · 3 of 66 slices shown]
[im 22/66  brain]
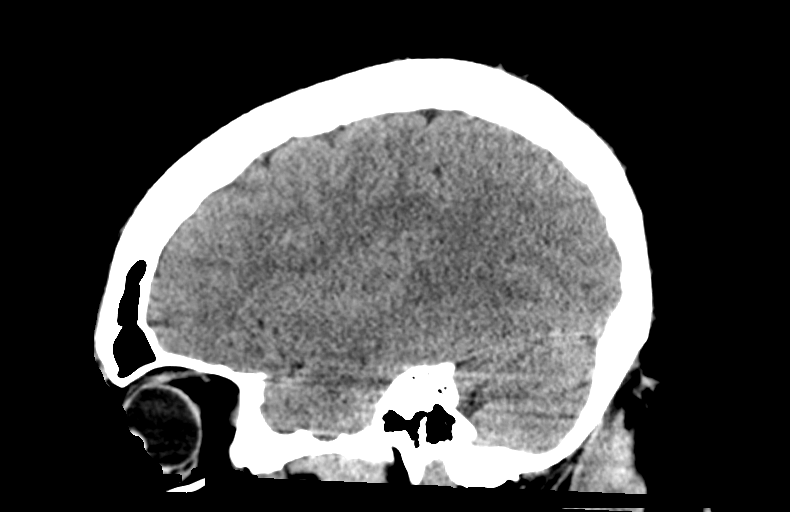
[im 33/66  brain]
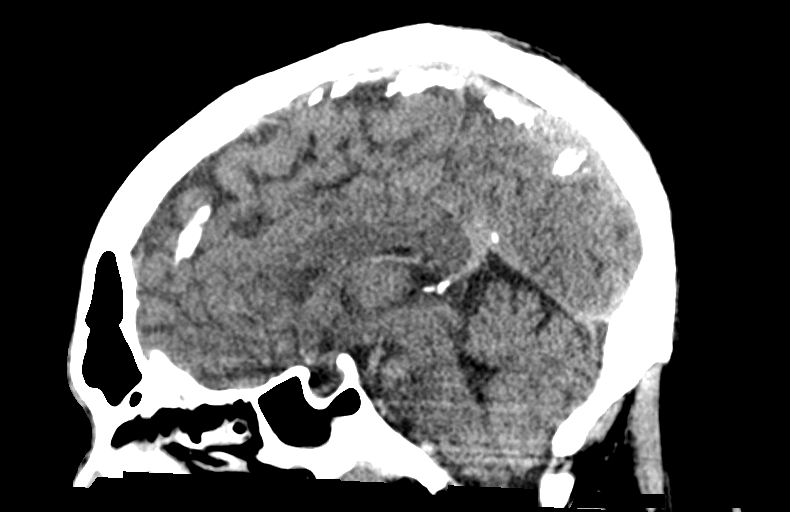
[im 44/66  brain]
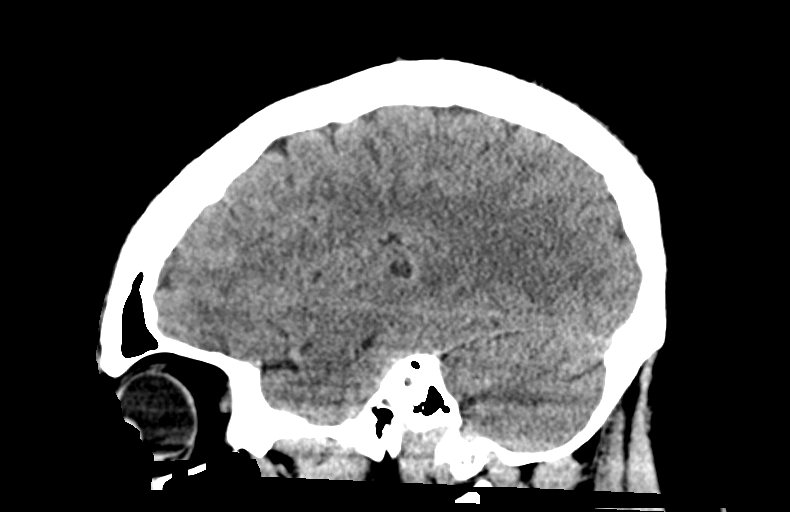

[15 of 47 positions shown; findings below may reference images not displayed]

FINDINGS: Brain: The brain shows a normal appearance without evidence of
malformation, atrophy, old or acute small or large vessel
infarction, mass lesion, hemorrhage, hydrocephalus or extra-axial
collection.

Vascular: No hyperdense vessel. No evidence of atherosclerotic
calcification.

Skull: Normal.  No traumatic finding.  No focal bone lesion.

Sinuses/Orbits: Sinuses are clear. Orbits appear normal. Mastoids
are clear.

Other: None significant
IMPRESSION: Normal head CT.

## 2023-09-26 ENCOUNTER — Ambulatory Visit: Payer: Managed Care, Other (non HMO) | Admitting: Neurology

## 2023-09-26 ENCOUNTER — Encounter: Payer: Self-pay | Admitting: Neurology

## 2023-09-26 VITALS — BP 142/94 | HR 97 | Ht 71.0 in | Wt 238.0 lb

## 2023-09-26 DIAGNOSIS — Z5181 Encounter for therapeutic drug level monitoring: Secondary | ICD-10-CM

## 2023-09-26 DIAGNOSIS — G40909 Epilepsy, unspecified, not intractable, without status epilepticus: Secondary | ICD-10-CM

## 2023-09-26 MED ORDER — LEVETIRACETAM 500 MG PO TABS: 500.0000 mg | ORAL_TABLET | Freq: Two times a day (BID) | ORAL | 4 refills | Status: AC

## 2023-09-26 NOTE — Patient Instructions (Signed)
 Continue with Keppra  500 mg twice daily Continue your other medications Will check Keppra  level, lamotrigine level with BMP and vitamin D Follow-up in 1 year or sooner if worse. Please call us  if any seizure or any other concerns.

## 2023-09-26 NOTE — Progress Notes (Signed)
 [facility]  PATIENT: William Flowers DOB: November 12, 1990  REQUESTING CLINICIAN: Leonarda Rakers, NP HISTORY FROM: Patient  REASON FOR VISIT: seizure follow up   HISTORICAL  CHIEF COMPLAINT:  Chief Complaint  Patient presents with   Seizures    RM 13. Seizures: pt reports last seizure over 3 years ago. Well controlled w Kepra. Reports no thoughts of harm.    INTERVAL HISTORY 09/26/2023:  Patient presents today for follow-up, last visit was a year ago. Since then, he has been doing well.  He is compliant with his Keppra  500 mg twice daily, denies any side effect from the medication.  He is also compliant with his lamotrigine prescribed by his psychiatrist, tells me that his mood is better.  Denies any side effects on the medications, overall he is doing very well, no other complaints or concerns.  INTERVAL HISTORY 09/21/2022:  Patient presents today for follow-up, last visit was a year ago.  Since then, he has been doing well no seizure or seizure-like activity.  He is compliant with his Keppra  500 mg twice daily, denies any side effect.  His psychiatrist started him on lamotrigine, he reports less agitation with the lamotrigine.  He is undergoing some stressors right now, going thru a divorce but overall he is doing very well.  He would like to stay on the Keppra  for another year.    INTERVAL HISTORY 09/20/2021:  Patient presents today for follow-up, last visit was in November, since that time he denies any additional seizures.  He is compliant with Keppra  500 mg twice daily, reported mild side effect of irritability but his psychiatrist increased his Vraylar to 300 mg daily.  Reports that he is doing well.  He started driving, no additional complaints or additional concerns.  His Keppra  level was normal at 12.3.   HISTORY OF PRESENT ILLNESS:  This is a 33 year old gentleman past medical history of bipolar disorder who is presenting to the clinic after his first  generalized seizure.  Patient states that on October 18 he was at his wife's job, [employer], said that he began to feel hot as asked for water, wife went to get him water and next thing that he knows is that he was on the floor with blood around his mouth and people talking around him.  He said initially he was not talking but was able to hear what was going on.  Per wife patient stood up and fell straight to the ground while making a grunting noise, wife rush to him initially he had his eyes rolled back, he was unresponsive then his body started tensing up (tonic phase) and started shaking in the upper and lower extremities.  The entire episode lasted about 2-minute then subsided.  He was taken to the emergency department.  In the ED, he had a MRI which was normal and EEG which did not show any seizure or abnormal epileptiform discharge and he was discharged the next day on Keppra  500 mg BID.  On further history patient report episode of tunnel vision happening at work usually 1-2 episode per week, they always start with tunnel vision he gets fixated on one point in his computer, afterward he felt like he lost time and will feel sick described as being nauseous.  The episodes started in August. He did speak with the psychiatrist who changed the time when he is supposed to take his Abilify  from morning to evening, stated that the tiredness decreased but he was still having  this episode.  Denies any previous history of generalized seizure, no family history of seizures, denies any seizure risk factor.  Since starting the Keppra  he has been seizure-free but wife reports some mild irritability. Since the seizure, Abilify  was replaced by Vraglar.    Handedness: Right handed   Onset: August/September with staring spells at work   Seizure Type: Likely focal   Current frequency: last seizure Oct 18  Any injuries from seizures: Chin laceration   Seizure risk factors:  No risk factor reported   Previous  ASMs: Levetiracetam    Currenty ASMs: Levetiracetam  500 mg BID, Lamotrigine (Prescribed by psychiatrist for mood)  ASMs side effects: None   Brain Images: Normal   Previous EEGs: Normal    OTHER MEDICAL CONDITIONS: Bipolar disorder, Depression   REVIEW OF SYSTEMS: Full 14 system review of systems performed and negative with exception of: as noted in the HPI  ALLERGIES: No Known Allergies  HOME MEDICATIONS: Outpatient Medications Prior to Visit  Medication Sig Dispense Refill   lamoTRIgine (LAMICTAL PO) Take by mouth daily. Psychiatrist prescribed, unsure of dose     levocetirizine (XYZAL) 5 MG tablet Take 5 mg by mouth daily as needed for allergies. (Patient not taking: Reported on 09/21/2022)     nadolol  (CORGARD ) 40 MG tablet Take 40 mg by mouth daily.     omeprazole (PRILOSEC) 40 MG capsule 1 CAPSULE 30 MINUTES BEFORE MORNING MEAL ORALLY ONCE A DAY 90 DAYS (Patient not taking: Reported on 09/21/2022)     VRAYLAR 1.5 MG capsule Take 3 mg by mouth daily.     levETIRAcetam  (KEPPRA ) 500 MG tablet Take 1 tablet (500 mg total) by mouth 2 (two) times daily. 180 tablet 4   No facility-administered medications prior to visit.    PAST MEDICAL HISTORY: Past Medical History:  Diagnosis Date   Anxiety    Bipolar 2 disorder (HCC)    Depression    Hypertension    Seizures (HCC)     PAST SURGICAL HISTORY: History reviewed. No pertinent surgical history.  FAMILY HISTORY: Family History  Problem Relation Age of Onset   Seizures Neg Hx     SOCIAL HISTORY: Social History   Socioeconomic History   Marital status: Married    Spouse name: Not on file   Number of children: Not on file   Years of education: Not on file   Highest education level: Not on file  Occupational History   Not on file  Tobacco Use   Smoking status: Never   Smokeless tobacco: Never  Vaping Use   Vaping status: Former  Substance and Sexual Activity   Alcohol use: Yes    Comment: occasionally   Drug  use: Never   Sexual activity: Yes  Other Topics Concern   Not on file  Social History Narrative   Lives with friend   Right handed   Caffeine 16-24oz daily   Social Drivers of Health   Financial Resource Strain: Not on file  Food Insecurity: Not on file  Transportation Needs: Not on file  Physical Activity: Not on file  Stress: Not on file  Social Connections: Not on file  Intimate Partner Violence: Not on file    PHYSICAL EXAM  GENERAL EXAM/CONSTITUTIONAL: Vitals:  Vitals:   09/26/23 0908  BP: (!) 142/94  Pulse: 97  Weight: 238 lb (108 kg)  Height: 5\' 11"  (1.803 m)    Body mass index is 33.19 kg/m. Wt Readings from Last 3 Encounters:  09/26/23 238 lb (108  kg)  09/21/22 246 lb (111.6 kg)  09/20/21 261 lb (118.4 kg)   Patient is in no distress; well developed, nourished and groomed; neck is supple  MUSCULOSKELETAL: Gait, strength, tone, movements noted in Neurologic exam below  NEUROLOGIC: MENTAL STATUS:      No data to display         awake, alert, oriented to person, place and time recent and remote memory intact normal attention and concentration language fluent, comprehension intact, naming intact fund of knowledge appropriate  CRANIAL NERVE:  2nd, 3rd, 4th, 6th - visual fields full to confrontation, extraocular muscles intact, no nystagmus 5th - facial sensation symmetric 7th - facial strength symmetric 8th - hearing intact 9th - palate elevates symmetrically, uvula midline 11th - shoulder shrug symmetric 12th - tongue protrusion midline  MOTOR:  normal bulk and tone, full strength in the BUE, BLE  SENSORY:  normal and symmetric to light touch  COORDINATION:  finger-nose-finger, fine finger movements normal   GAIT/STATION:  normal    DIAGNOSTIC DATA (LABS, IMAGING, TESTING) - I reviewed patient records, labs, notes, testing and imaging myself where available.  Lab Results  Component Value Date   WBC 9.4 02/16/2021   HGB 16.1  02/16/2021   HCT 46.5 02/16/2021   MCV 88.9 02/16/2021   PLT 245 02/16/2021      Component Value Date/Time   NA 136 02/16/2021 0545   K 3.8 02/16/2021 0545   CL 105 02/16/2021 0545   CO2 25 02/16/2021 0545   GLUCOSE 86 02/16/2021 0545   BUN 13 02/16/2021 0545   CREATININE 0.93 02/16/2021 0545   CALCIUM 8.9 02/16/2021 0545   GFRNONAA >60 02/16/2021 0545   No results found for: "CHOL", "HDL", "LDLCALC", "LDLDIRECT", "TRIG" No results found for: "HGBA1C" No results found for: "VITAMINB12" No results found for: "TSH"  Levetiracetam  level, March 18, 2021:    12.3  MRI Brain  Unremarkable appearance of the brain  EEG: This study is within normal limits. No seizures or epileptiform discharges were seen throughout the recording   I personally reviewed brain Images and previous EEG reports.   ASSESSMENT AND PLAN  33 y.o. year old male  with past medical history of bipolar disorder and seizure who is presenting for follow up.  He is doing well on Keppra  500 mg twice daily, denies any side effect.  His psychiatrist also started him on lamotrigine and he is doing well less agitation.  Plan for now is to continue with Keppra  500 mg twice daily.  Will check labs and we will see him in 1 year for follow-up.   1. Seizure disorder (HCC)   2. Therapeutic drug monitoring      Patient Instructions  Continue with Keppra  500 mg twice daily Continue your other medications Will check Keppra  level, lamotrigine level with BMP and vitamin D Follow-up in 1 year or sooner if worse. Please call us  if any seizure or any other concerns.   Per [place]  DMV statutes, patients with seizures are not allowed to drive until they have been seizure-free for six months.  Other recommendations include using caution when using heavy equipment or power tools. Avoid working on ladders or at heights. Take showers instead of baths.  Do not swim alone.  Ensure the water temperature is not too high on the  home water heater. Do not go swimming alone. Do not lock yourself in a room alone (i.e. bathroom). When caring for infants or small children, sit down when holding,  feeding, or changing them to minimize risk of injury to the child in the event you have a seizure. Maintain good sleep hygiene. Avoid alcohol.  Also recommend adequate sleep, hydration, good diet and minimize stress.   During the Seizure  - First, ensure adequate ventilation and place patients on the floor on their left side  Loosen clothing around the neck and ensure the airway is patent. If the patient is clenching the teeth, do not force the mouth open with any object as this can cause severe damage - Remove all items from the surrounding that can be hazardous. The patient may be oblivious to what's happening and may not even know what he or she is doing. If the patient is confused and wandering, either gently guide him/her away and block access to outside areas - Reassure the individual and be comforting - Call 911. In most cases, the seizure ends before EMS arrives. However, there are cases when seizures may last over 3 to 5 minutes. Or the individual may have developed breathing difficulties or severe injuries. If a pregnant patient or a person with diabetes develops a seizure, it is prudent to call an ambulance. - Finally, if the patient does not regain full consciousness, then call EMS. Most patients will remain confused for about 45 to 90 minutes after a seizure, so you must use judgment in calling for help. - Avoid restraints but make sure the patient is in a bed with padded side rails - Place the individual in a lateral position with the neck slightly flexed; this will help the saliva drain from the mouth and prevent the tongue from falling backward - Remove all nearby furniture and other hazards from the area - Provide verbal assurance as the individual is regaining consciousness - Provide the patient with privacy if  possible - Call for help and start treatment as ordered by the caregiver   After the Seizure (Postictal Stage)  After a seizure, most patients experience confusion, fatigue, muscle pain and/or a headache. Thus, one should permit the individual to sleep. For the next few days, reassurance is essential. Being calm and helping reorient the person is also of importance.  Most seizures are painless and end spontaneously. Seizures are not harmful to others but can lead to complications such as stress on the lungs, brain and the heart. Individuals with prior lung problems may develop labored breathing and respiratory distress.     Orders Placed This Encounter  Procedures   Levetiracetam  level   Lamotrigine  level   Basic Metabolic Panel   Vitamin D , 25-hydroxy    Meds ordered this encounter  Medications   levETIRAcetam  (KEPPRA ) 500 MG tablet    Sig: Take 1 tablet (500 mg total) by mouth 2 (two) times daily.    Dispense:  180 tablet    Refill:  4    Return in about 1 year (around 09/25/2024).    Cassandra Cleveland, MD 09/26/2023, 9:30 AM  [facility] [address], [address] [place], [place] [address] [phone]

## 2023-09-27 ENCOUNTER — Ambulatory Visit: Payer: Self-pay | Admitting: Neurology

## 2023-09-27 ENCOUNTER — Other Ambulatory Visit: Payer: Self-pay | Admitting: Neurology

## 2023-09-27 LAB — BASIC METABOLIC PANEL WITH GFR
BUN/Creatinine Ratio: 14 (ref 9–20)
BUN: 16 mg/dL (ref 6–20)
CO2: 22 mmol/L (ref 20–29)
Calcium: 9.1 mg/dL (ref 8.7–10.2)
Chloride: 102 mmol/L (ref 96–106)
Creatinine, Ser: 1.15 mg/dL (ref 0.76–1.27)
Glucose: 98 mg/dL (ref 70–99)
Potassium: 4.8 mmol/L (ref 3.5–5.2)
Sodium: 140 mmol/L (ref 134–144)
eGFR: 88 mL/min/{1.73_m2} (ref >59)

## 2023-09-27 LAB — VITAMIN D 25 HYDROXY (VIT D DEFICIENCY, FRACTURES): Vit D, 25-Hydroxy: 17.3 ng/mL — ABNORMAL LOW (ref 30.0–100.0)

## 2023-09-27 LAB — LAMOTRIGINE LEVEL: Lamotrigine Lvl: 3.6 ug/mL (ref 2.0–20.0)

## 2023-09-27 LAB — LEVETIRACETAM LEVEL: Levetiracetam Lvl: 14.2 ug/mL (ref 10.0–40.0)

## 2023-09-27 MED ORDER — VITAMIN D (ERGOCALCIFEROL) 1.25 MG (50000 UNIT) PO CAPS: 50000.0000 [IU] | ORAL_CAPSULE | ORAL | 0 refills | Status: AC

## 2024-09-25 ENCOUNTER — Ambulatory Visit: Admitting: Neurology

## 2024-10-01 ENCOUNTER — Ambulatory Visit: Admitting: Neurology
